# Patient Record
Sex: Female | Born: 1950 | Race: White | Hispanic: No | Marital: Married | Smoking: Never smoker
Health system: Southern US, Community
[De-identification: ages and names within clinical notes are randomized; demographics above are authoritative.]

## PROBLEM LIST (undated history)

## (undated) DIAGNOSIS — E079 Disorder of thyroid, unspecified: Secondary | ICD-10-CM

## (undated) DIAGNOSIS — K317 Polyp of stomach and duodenum: Secondary | ICD-10-CM

## (undated) DIAGNOSIS — E7800 Pure hypercholesterolemia, unspecified: Secondary | ICD-10-CM

## (undated) DIAGNOSIS — D649 Anemia, unspecified: Secondary | ICD-10-CM

## (undated) DIAGNOSIS — H905 Unspecified sensorineural hearing loss: Secondary | ICD-10-CM

## (undated) DIAGNOSIS — E611 Iron deficiency: Secondary | ICD-10-CM

## (undated) DIAGNOSIS — K760 Fatty (change of) liver, not elsewhere classified: Secondary | ICD-10-CM

## (undated) DIAGNOSIS — Z860101 Personal history of adenomatous and serrated colon polyps: Secondary | ICD-10-CM

## (undated) DIAGNOSIS — L309 Dermatitis, unspecified: Secondary | ICD-10-CM

## (undated) DIAGNOSIS — Z961 Presence of intraocular lens: Secondary | ICD-10-CM

## (undated) DIAGNOSIS — A048 Other specified bacterial intestinal infections: Secondary | ICD-10-CM

## (undated) DIAGNOSIS — E039 Hypothyroidism, unspecified: Secondary | ICD-10-CM

## (undated) DIAGNOSIS — E785 Hyperlipidemia, unspecified: Secondary | ICD-10-CM

## (undated) DIAGNOSIS — M109 Gout, unspecified: Secondary | ICD-10-CM

## (undated) DIAGNOSIS — E538 Deficiency of other specified B group vitamins: Secondary | ICD-10-CM

## (undated) DIAGNOSIS — I10 Essential (primary) hypertension: Secondary | ICD-10-CM

## (undated) HISTORY — DX: Hyperlipidemia, unspecified: E78.5

## (undated) HISTORY — DX: Essential (primary) hypertension: I10

## (undated) HISTORY — DX: Disorder of thyroid, unspecified: E07.9

## (undated) HISTORY — DX: Gout, unspecified: M10.9

## (undated) HISTORY — PX: COLONOSCOPY: SHX174

## (undated) HISTORY — PX: TOTAL ABDOMINAL HYSTERECTOMY W/ BILATERAL SALPINGOOPHORECTOMY: SHX83

---

## 2004-02-09 ENCOUNTER — Ambulatory Visit: Payer: Self-pay | Admitting: Family Medicine

## 2004-02-13 ENCOUNTER — Ambulatory Visit: Payer: Self-pay | Admitting: Family Medicine

## 2004-02-29 HISTORY — PX: BREAST BIOPSY: SHX20

## 2004-10-01 ENCOUNTER — Ambulatory Visit: Payer: Self-pay | Admitting: General Surgery

## 2005-03-22 ENCOUNTER — Ambulatory Visit: Payer: Self-pay | Admitting: Family Medicine

## 2006-01-26 ENCOUNTER — Ambulatory Visit: Payer: Self-pay | Admitting: Family Medicine

## 2006-03-24 ENCOUNTER — Ambulatory Visit: Payer: Self-pay | Admitting: Family Medicine

## 2007-03-26 ENCOUNTER — Ambulatory Visit: Payer: Self-pay | Admitting: Family Medicine

## 2007-07-18 ENCOUNTER — Other Ambulatory Visit: Payer: Self-pay

## 2007-07-18 ENCOUNTER — Ambulatory Visit: Payer: Self-pay | Admitting: Unknown Physician Specialty

## 2007-07-30 HISTORY — PX: ABDOMINAL HYSTERECTOMY: SHX81

## 2007-07-31 ENCOUNTER — Ambulatory Visit: Payer: Self-pay | Admitting: Unknown Physician Specialty

## 2007-10-30 ENCOUNTER — Ambulatory Visit: Payer: Self-pay | Admitting: Family Medicine

## 2008-03-26 ENCOUNTER — Ambulatory Visit: Payer: Self-pay | Admitting: Family Medicine

## 2009-04-08 ENCOUNTER — Ambulatory Visit: Payer: Self-pay | Admitting: Family Medicine

## 2010-02-02 ENCOUNTER — Observation Stay: Payer: Self-pay | Admitting: Internal Medicine

## 2010-04-12 ENCOUNTER — Ambulatory Visit: Payer: Self-pay | Admitting: Family Medicine

## 2011-05-13 ENCOUNTER — Ambulatory Visit: Payer: Self-pay | Admitting: Family Medicine

## 2012-05-30 ENCOUNTER — Ambulatory Visit: Payer: Self-pay | Admitting: Family Medicine

## 2013-06-19 ENCOUNTER — Ambulatory Visit: Payer: Self-pay | Admitting: Family Medicine

## 2014-06-24 ENCOUNTER — Ambulatory Visit
Admit: 2014-06-24 | Disposition: A | Discharge status: Home / Self Care | Payer: Self-pay | Attending: Family Medicine | Admitting: Family Medicine

## 2015-06-17 HISTORY — PX: BREAST BIOPSY: SHX20

## 2015-10-30 HISTORY — PX: EYE SURGERY: SHX253

## 2016-05-17 ENCOUNTER — Other Ambulatory Visit: Payer: Self-pay | Admitting: Family Medicine

## 2016-05-17 DIAGNOSIS — N6452 Nipple discharge: Secondary | ICD-10-CM

## 2016-05-17 DIAGNOSIS — Z1239 Encounter for other screening for malignant neoplasm of breast: Secondary | ICD-10-CM

## 2016-05-25 ENCOUNTER — Ambulatory Visit
Admission: RE | Admit: 2016-05-25 | Discharge: 2016-05-25 | Disposition: A | Discharge status: Home / Self Care | Payer: BLUE CROSS/BLUE SHIELD | Source: Ambulatory Visit | Attending: Family Medicine | Admitting: Family Medicine

## 2016-05-25 DIAGNOSIS — Z1239 Encounter for other screening for malignant neoplasm of breast: Secondary | ICD-10-CM

## 2016-05-25 DIAGNOSIS — N6310 Unspecified lump in the right breast, unspecified quadrant: Secondary | ICD-10-CM | POA: Insufficient documentation

## 2016-05-25 DIAGNOSIS — N6452 Nipple discharge: Secondary | ICD-10-CM

## 2016-05-26 ENCOUNTER — Emergency Department
Admission: EM | Admit: 2016-05-26 | Discharge: 2016-05-26 | Disposition: A | Discharge status: Home / Self Care | Payer: Worker's Compensation | Attending: Emergency Medicine | Admitting: Emergency Medicine

## 2016-05-26 ENCOUNTER — Encounter: Payer: Self-pay | Admitting: Emergency Medicine

## 2016-05-26 DIAGNOSIS — T7840XA Allergy, unspecified, initial encounter: Secondary | ICD-10-CM | POA: Diagnosis present

## 2016-05-26 DIAGNOSIS — T7589XA Other specified effects of external causes, initial encounter: Secondary | ICD-10-CM

## 2016-05-26 DIAGNOSIS — J9801 Acute bronchospasm: Secondary | ICD-10-CM | POA: Insufficient documentation

## 2016-05-26 DIAGNOSIS — Z77098 Contact with and (suspected) exposure to other hazardous, chiefly nonmedicinal, chemicals: Secondary | ICD-10-CM | POA: Diagnosis not present

## 2016-05-26 DIAGNOSIS — IMO0001 Reserved for inherently not codable concepts without codable children: Secondary | ICD-10-CM

## 2016-05-26 MED ORDER — IPRATROPIUM-ALBUTEROL 0.5-2.5 (3) MG/3ML IN SOLN
3.0000 mL | Freq: Once | RESPIRATORY_TRACT | Status: AC
  Administered 2016-05-26: 3 mL via RESPIRATORY_TRACT
  Filled 2016-05-26: qty 3

## 2016-05-26 NOTE — ED Triage Notes (Signed)
Pt at work this am, states they were spraying lysol in the office next to her, she began having a hard time catching her breath, states fragrance makes her struggle to breathe. Pt appears in no distress at this time, no coughing or increased respirations. Workers Comp Case.

## 2016-05-26 NOTE — Discharge Instructions (Signed)
Your exam is essentially normal following your exposure to an inhaled irritant. You should consider taking a daily allergy medicine. Avoid exposures by immediately removing yourself from the irritant, to a room or outdoors, away from the offending fumes/odors.

## 2016-05-26 NOTE — ED Provider Notes (Signed)
[facility] Emergency Department Provider Note ____________________________________________  Time seen: 1329  I have reviewed the triage vital signs and the nursing notes.  HISTORY  Chief Complaint  Allergic Reaction  HPI Lindsey Todd is a 66 y.o. female presents to the ED, accompanied by her husband, from her jobsite, following exposure to Lysol brand spray. She reports that her coworker was spraying the office next door, because the occupant was diagnosed with influenza. She began to experience coughing and shortness of breath, as she smelled the spray enter her office. She denies any nausea, vomiting, wheezing, or difficulty swallowing. She denies watery eyes, runny nose, or throat irritation/swelling. She has known sensitivity to fragrances which lead to a similar reaction. She typically walks outside to avoid the exposure, until her symptoms of coughing dissipate. She denies any current or past treatment for environmental or seasonal allergies with antihistamines, nasal sprays, decongestants, steroids, or allergy serum injections.   History reviewed. No pertinent past medical history.  There are no active problems to display for this patient.   Past Surgical History:  Procedure Laterality Date  . BREAST BIOPSY Right 2006   benign    Prior to Admission medications   Not on File    Allergies Patient has no known allergies.  No family history on file.  Social History Social History  Substance Use Topics  . Smoking status: Never Smoker  . Smokeless tobacco: Never Used  . Alcohol use No    Review of Systems  Constitutional: Negative for fever. Eyes: Negative for visual changes. Denies watery eyes ENT: Negative for sore throat. Denies runny nose Cardiovascular: Negative for chest pain. Respiratory: Positive for shortness of breath and cough. Gastrointestinal: Negative for abdominal pain, vomiting and diarrhea. Skin: Negative for  rash. Neurological: Negative for headaches, focal weakness or numbness. ____________________________________________  PHYSICAL EXAM:  VITAL SIGNS: ED Triage Vitals  Enc Vitals Group     BP 05/26/16 1235 (!) 188/85     Pulse Rate 05/26/16 1225 (!) 102     Resp 05/26/16 1235 18     Temp 05/26/16 1235 97.9 F (36.6 C)     Temp Source 05/26/16 1235 Oral     SpO2 05/26/16 1225 100 %     Weight 05/26/16 1235 250 lb (113.4 kg)     Height 05/26/16 1235 5\' 7"  (1.702 m)     Head Circumference --      Peak Flow --      Pain Score 05/26/16 1235 0     Pain Loc --      Pain Edu? --      Excl. in [place]? --    Constitutional: Alert and oriented. Well appearing and in no distress. Head: Normocephalic and atraumatic. Eyes: Conjunctivae are normal. PERRL. Normal extraocular movements Ears: Canals clear. TMs intact bilaterally. Nose: No congestion/rhinorrhea/epistaxis. Turbinates are moist, pale, and mildly edematous. Mouth/Throat: Mucous membranes are moist. Uvula is midline and tonsils are flat.  Neck: Supple. No thyromegaly. Hematological/Lymphatic/Immunological: No cervical lymphadenopathy. Cardiovascular: Normal rate, regular rhythm. Normal distal pulses. Respiratory: Normal respiratory effort. No wheezes/rales/rhonchi. Skin:  Skin is warm, dry and intact. No rash noted. Psychiatric: Mood and affect are normal. Patient exhibits appropriate insight and judgment. ____________________________________________  PROCEDURES  DuoNeb x 1 ____________________________________________  INITIAL IMPRESSION / ASSESSMENT AND PLAN / ED COURSE  Patient with acute bronchospasms due to inhaled irritant exposure. She is at least 4 hours status-post initial exposure. She is without respiratory distress or signs of anaphylaxis on  presentation. She exhibits a mild, intermittent cough during her tenure. She reports symptoms have basically resolved at the time of discharge. She is encouraged to restart her daily  Zyrtec. A work note for the remainder of the day is provided.  She has pre-existing plans to see an allergist in May.  ____________________________________________  FINAL CLINICAL IMPRESSION(S) / ED DIAGNOSES  Final diagnoses:  Acute bronchospasm  Exposure to respiratory irritant, initial encounter      Melvenia Needles, PA-C 05/26/16 1412    Eula Listen, MD 05/26/16 [phone]

## 2016-05-26 NOTE — ED Notes (Signed)
Pt sitting in wheelchair, family present, pt appears in no distress at this time.

## 2016-05-26 NOTE — ED Notes (Signed)
Pt states she has had a cold since late last week; she was near an office that was sprayed with Lysol today. She states that she could feel the lysol entering her lungs and she felt sob. She reports also that she was unable to drive because she felt nervous and jittery. She reports dry mouth and burning throat, but denies any feeling of her throat closing up or swelling in her mouth. Pt states her earlobes are still burning. No visible rash on face or ears. Pt states it is "a little bit better than it had been" at this time. It has been 2 hours since she left the office where she was exposed.

## 2016-05-26 NOTE — ED Notes (Signed)
No use of epi pen.

## 2016-05-26 NOTE — ED Notes (Signed)
Per workers comp profile for [employer], no drug screen is required.

## 2016-05-31 ENCOUNTER — Other Ambulatory Visit: Payer: Self-pay | Admitting: Family Medicine

## 2016-05-31 DIAGNOSIS — N6310 Unspecified lump in the right breast, unspecified quadrant: Secondary | ICD-10-CM

## 2016-05-31 DIAGNOSIS — R928 Other abnormal and inconclusive findings on diagnostic imaging of breast: Secondary | ICD-10-CM

## 2016-06-16 ENCOUNTER — Other Ambulatory Visit: Payer: Self-pay | Admitting: Diagnostic Radiology

## 2016-06-16 ENCOUNTER — Ambulatory Visit
Admission: RE | Admit: 2016-06-16 | Discharge: 2016-06-16 | Disposition: A | Discharge status: Home / Self Care | Payer: BLUE CROSS/BLUE SHIELD | Source: Ambulatory Visit | Attending: Family Medicine | Admitting: Family Medicine

## 2016-06-16 DIAGNOSIS — N6310 Unspecified lump in the right breast, unspecified quadrant: Secondary | ICD-10-CM | POA: Diagnosis present

## 2016-06-16 DIAGNOSIS — R928 Other abnormal and inconclusive findings on diagnostic imaging of breast: Secondary | ICD-10-CM | POA: Diagnosis present

## 2016-06-16 DIAGNOSIS — D241 Benign neoplasm of right breast: Secondary | ICD-10-CM | POA: Diagnosis not present

## 2016-06-17 LAB — SURGICAL PATHOLOGY

## 2016-06-28 ENCOUNTER — Encounter: Payer: Self-pay | Admitting: *Deleted

## 2016-07-04 ENCOUNTER — Ambulatory Visit (INDEPENDENT_AMBULATORY_CARE_PROVIDER_SITE_OTHER): Payer: BLUE CROSS/BLUE SHIELD | Admitting: General Surgery

## 2016-07-04 ENCOUNTER — Encounter: Payer: Self-pay | Admitting: General Surgery

## 2016-07-04 VITALS — BP 138/90 | HR 90 | Resp 14 | Ht 67.0 in | Wt 267.0 lb

## 2016-07-04 DIAGNOSIS — D241 Benign neoplasm of right breast: Secondary | ICD-10-CM

## 2016-07-04 NOTE — Progress Notes (Signed)
Patient ID: LEGEND PECORE, female   DOB: [birth year], 66 y.o.   MRN: [id number]  Chief Complaint  Patient presents with  . Other    breast papilloma    HPI Lindsey Todd is a 66 y.o. female here for discussion of breast biopsy results. Her last mammogram was 05/25/16. She had been having some clear nipple discharge from her right breast in December. She had a biopsy of the right breast done on 06/16/16 and was diagnosed with a breast papilloma. She has not had any discharge since her biopsy. She had felt fullness in the right breast but no pain. She had a stereo biopsy in 2006 on the right side.  HPI  Past Medical History:  Diagnosis Date  . Gout   . Hyperlipidemia   . Hypertension   . Thyroid disease     Past Surgical History:  Procedure Laterality Date  . ABDOMINAL HYSTERECTOMY  07/2007  . BREAST BIOPSY Right 2006   benign  . BREAST BIOPSY Right 06/17/2015   right breast biopsy path pending   . EYE SURGERY Bilateral 10/2015    No family history on file.  Social History Social History  Substance Use Topics  . Smoking status: Never Smoker  . Smokeless tobacco: Never Used  . Alcohol use No    No Known Allergies  Current Outpatient Prescriptions  Medication Sig Dispense Refill  . allopurinol (ZYLOPRIM) 100 MG tablet One by mouth daily to prevent gout    . amLODipine (NORVASC) 5 MG tablet Take by mouth.    . Ascorbic Acid (VITAMIN C) 100 MG tablet Take by mouth.    Marland Kitchen atorvastatin (LIPITOR) 10 MG tablet Take by mouth.    . colchicine 0.6 MG tablet   0  . Iron-Vitamin C (IRON 100/C PO) Take by mouth.    . levothyroxine (SYNTHROID, LEVOTHROID) 25 MCG tablet   0   No current facility-administered medications for this visit.     Review of Systems Review of Systems  Constitutional: Negative.   Respiratory: Negative.   Cardiovascular: Negative.     Blood pressure 138/90, pulse 90, resp. rate 14, height 5\' 7"  (1.702 m), weight 267 lb (121.1 kg).  Physical  Exam Physical Exam  Constitutional: She is oriented to person, place, and time. She appears well-developed and well-nourished.  Eyes: Conjunctivae are normal. No scleral icterus.  Neck: Neck supple.  Cardiovascular: Normal rate, regular rhythm and normal heart sounds.   Pulmonary/Chest: Effort normal and breath sounds normal. Right breast exhibits nipple discharge (single drop). Right breast exhibits no inverted nipple, no mass, no skin change and no tenderness. Left breast exhibits no inverted nipple, no mass, no nipple discharge, no skin change and no tenderness. Breasts are asymmetrical (right breast 1 cup size larger than left).    Lymphadenopathy:    She has no cervical adenopathy.    She has no axillary adenopathy.  Neurological: She is alert and oriented to person, place, and time.  Skin: Skin is warm and dry.  Psychiatric: She has a normal mood and affect.    Data Reviewed 06/24/2014 and 05/25/2016 screening mammograms were reviewed. Right breast ultrasound of 05/25/2016 reviewed. Ultrasound-guided biopsy completed 22 days after her original diagnostic imaging study reviewed.  DIAGNOSIS:  A. BREAST, RIGHT 9:00, 1 CM FN; ULTRASOUND GUIDED BIOPSY:  - INTRADUCTAL PAPILLOMA.  - NEGATIVE FOR ATYPIA AND MALIGNANCY.   Assessment    Nipple drainage likely related to papilloma    Plan    Options for  management were reviewed: 1) surgical excision versus 2) observation.  Pros and cons of each pathway reviewed. More office visits with the latter, more invasive procedure with the former.  The American Society of Breast Surgeons consensus guidelines on concordance accepts observation for core needle biopsy diagnosis of papilloma if a formal plan a follow-up through risk based shared decision making with the patient. Risk of upgrade from. Papillary lesions has been less than 10% in the last decade.(December 31, 2014)/ At this time the patient and I both comfortable with observation. She  has been requested to call promptly should she develop recurrent spontaneous nipple drainage, breast tenderness, fullness or pain.    HPI, Physical Exam, Assessment and Plan have been scribed under the direction and in the presence of Robert Bellow, MD  Lindsey Living, LPN  I have completed the exam and reviewed the above documentation for accuracy and completeness.  I agree with the above.  Haematologist has been used and any errors in dictation or transcription are unintentional.  Hervey Ard, M.D., F.A.C.S.   Robert Bellow 07/04/2016, 6:28 PM

## 2016-07-04 NOTE — Patient Instructions (Signed)
Right breast diagnostic mammogram and follow up in 6 months. Call with any problems or concerns.

## 2016-09-15 ENCOUNTER — Encounter: Payer: Self-pay | Admitting: *Deleted

## 2016-09-16 ENCOUNTER — Ambulatory Visit: Payer: BLUE CROSS/BLUE SHIELD | Admitting: Anesthesiology

## 2016-09-16 ENCOUNTER — Ambulatory Visit
Admission: RE | Admit: 2016-09-16 | Discharge: 2016-09-16 | Disposition: A | Discharge status: Home / Self Care | Payer: BLUE CROSS/BLUE SHIELD | Source: Ambulatory Visit | Attending: Unknown Physician Specialty | Admitting: Unknown Physician Specialty

## 2016-09-16 ENCOUNTER — Encounter
Admission: RE | Disposition: A | Discharge status: Home / Self Care | Payer: Self-pay | Source: Ambulatory Visit | Attending: Unknown Physician Specialty

## 2016-09-16 ENCOUNTER — Encounter: Payer: Self-pay | Admitting: *Deleted

## 2016-09-16 DIAGNOSIS — D509 Iron deficiency anemia, unspecified: Secondary | ICD-10-CM | POA: Insufficient documentation

## 2016-09-16 DIAGNOSIS — D127 Benign neoplasm of rectosigmoid junction: Secondary | ICD-10-CM | POA: Insufficient documentation

## 2016-09-16 DIAGNOSIS — E785 Hyperlipidemia, unspecified: Secondary | ICD-10-CM | POA: Insufficient documentation

## 2016-09-16 DIAGNOSIS — K766 Portal hypertension: Secondary | ICD-10-CM | POA: Insufficient documentation

## 2016-09-16 DIAGNOSIS — K317 Polyp of stomach and duodenum: Secondary | ICD-10-CM | POA: Diagnosis not present

## 2016-09-16 DIAGNOSIS — Z90710 Acquired absence of both cervix and uterus: Secondary | ICD-10-CM | POA: Diagnosis not present

## 2016-09-16 DIAGNOSIS — K2970 Gastritis, unspecified, without bleeding: Secondary | ICD-10-CM | POA: Diagnosis not present

## 2016-09-16 DIAGNOSIS — E7800 Pure hypercholesterolemia, unspecified: Secondary | ICD-10-CM | POA: Diagnosis not present

## 2016-09-16 DIAGNOSIS — D125 Benign neoplasm of sigmoid colon: Secondary | ICD-10-CM | POA: Insufficient documentation

## 2016-09-16 DIAGNOSIS — Z90722 Acquired absence of ovaries, bilateral: Secondary | ICD-10-CM | POA: Diagnosis not present

## 2016-09-16 DIAGNOSIS — E538 Deficiency of other specified B group vitamins: Secondary | ICD-10-CM | POA: Insufficient documentation

## 2016-09-16 DIAGNOSIS — I10 Essential (primary) hypertension: Secondary | ICD-10-CM | POA: Insufficient documentation

## 2016-09-16 DIAGNOSIS — K5730 Diverticulosis of large intestine without perforation or abscess without bleeding: Secondary | ICD-10-CM | POA: Insufficient documentation

## 2016-09-16 DIAGNOSIS — E039 Hypothyroidism, unspecified: Secondary | ICD-10-CM | POA: Diagnosis not present

## 2016-09-16 DIAGNOSIS — M109 Gout, unspecified: Secondary | ICD-10-CM | POA: Insufficient documentation

## 2016-09-16 DIAGNOSIS — K3189 Other diseases of stomach and duodenum: Secondary | ICD-10-CM | POA: Insufficient documentation

## 2016-09-16 DIAGNOSIS — Z79899 Other long term (current) drug therapy: Secondary | ICD-10-CM | POA: Diagnosis not present

## 2016-09-16 DIAGNOSIS — D649 Anemia, unspecified: Secondary | ICD-10-CM | POA: Diagnosis not present

## 2016-09-16 HISTORY — DX: Iron deficiency: E61.1

## 2016-09-16 HISTORY — PX: ESOPHAGOGASTRODUODENOSCOPY (EGD) WITH PROPOFOL: SHX5813

## 2016-09-16 HISTORY — PX: COLONOSCOPY WITH PROPOFOL: SHX5780

## 2016-09-16 HISTORY — DX: Deficiency of other specified B group vitamins: E53.8

## 2016-09-16 HISTORY — DX: Pure hypercholesterolemia, unspecified: E78.00

## 2016-09-16 HISTORY — DX: Hypothyroidism, unspecified: E03.9

## 2016-09-16 HISTORY — DX: Anemia, unspecified: D64.9

## 2016-09-16 SURGERY — ESOPHAGOGASTRODUODENOSCOPY (EGD) WITH PROPOFOL
Anesthesia: General

## 2016-09-16 MED ORDER — PROPOFOL 500 MG/50ML IV EMUL
INTRAVENOUS | Status: AC
  Filled 2016-09-16: qty 50

## 2016-09-16 MED ORDER — PROPOFOL 10 MG/ML IV BOLUS
INTRAVENOUS | Status: AC
  Filled 2016-09-16: qty 20

## 2016-09-16 MED ORDER — MIDAZOLAM HCL 2 MG/2ML IJ SOLN
INTRAMUSCULAR | Status: DC | PRN
  Administered 2016-09-16 (×2): 1 mg via INTRAVENOUS

## 2016-09-16 MED ORDER — LIDOCAINE HCL (PF) 2 % IJ SOLN
INTRAMUSCULAR | Status: AC
  Filled 2016-09-16: qty 2

## 2016-09-16 MED ORDER — SODIUM CHLORIDE 0.9 % IV SOLN: INTRAVENOUS | Status: DC

## 2016-09-16 MED ORDER — SODIUM CHLORIDE 0.9 % IV SOLN
INTRAVENOUS | Status: DC
  Administered 2016-09-16: 1000 mL via INTRAVENOUS

## 2016-09-16 MED ORDER — MIDAZOLAM HCL 2 MG/2ML IJ SOLN
INTRAMUSCULAR | Status: AC
  Filled 2016-09-16: qty 2

## 2016-09-16 MED ORDER — LIDOCAINE HCL (CARDIAC) 20 MG/ML IV SOLN
INTRAVENOUS | Status: DC | PRN
  Administered 2016-09-16: 30 mg via INTRAVENOUS

## 2016-09-16 MED ORDER — PROPOFOL 500 MG/50ML IV EMUL
INTRAVENOUS | Status: DC | PRN
  Administered 2016-09-16: 120 ug/kg/min via INTRAVENOUS

## 2016-09-16 MED ORDER — PROPOFOL 10 MG/ML IV BOLUS
INTRAVENOUS | Status: DC | PRN
  Administered 2016-09-16 (×2): 20 mg via INTRAVENOUS
  Administered 2016-09-16: 10 mg via INTRAVENOUS
  Administered 2016-09-16: 30 mg via INTRAVENOUS

## 2016-09-16 NOTE — Anesthesia Postprocedure Evaluation (Signed)
Anesthesia Post Note  Patient: Lindsey Todd  Procedure(s) Performed: Procedure(s) (LRB): ESOPHAGOGASTRODUODENOSCOPY (EGD) WITH PROPOFOL (N/A) COLONOSCOPY WITH PROPOFOL (N/A)  Patient location during evaluation: Endoscopy Anesthesia Type: General Level of consciousness: awake and alert and oriented Pain management: pain level controlled Vital Signs Assessment: post-procedure vital signs reviewed and stable Respiratory status: spontaneous breathing, nonlabored ventilation and respiratory function stable Cardiovascular status: blood pressure returned to baseline and stable Postop Assessment: no signs of nausea or vomiting Anesthetic complications: no     Last Vitals:  Vitals:   09/16/16 1149 09/16/16 1159  BP: 126/73 128/87  Pulse: 80 71  Resp: 14 14  Temp:      Last Pain:  Vitals:   09/16/16 1129  TempSrc: Tympanic                 [name]

## 2016-09-16 NOTE — Op Note (Signed)
[facility] Gastroenterology Patient Name: Lindsey Todd Procedure Date: 09/16/2016 10:31 AM MRN: [id number] Account #: [id number] Date of Birth: 1951-01-24 Admit Type: Outpatient Age: 66 Room: [facility] ENDO ROOM 3 Gender: Female Note Status: Finalized Procedure:            Upper GI endoscopy Indications:          Unexplained iron deficiency anemia Providers:            Manya Silvas, MD Referring MD:         Gayland Curry MD, MD (Referring MD) Medicines:            Propofol per Anesthesia Complications:        No immediate complications. Procedure:            Pre-Anesthesia Assessment:                       - After reviewing the risks and benefits, the patient                        was deemed in satisfactory condition to undergo the                        procedure.                       After obtaining informed consent, the endoscope was                        passed under direct vision. Throughout the procedure,                        the patient's blood pressure, pulse, and oxygen                        saturations were monitored continuously. The Endoscope                        was introduced through the mouth, and advanced to the                        second part of duodenum. The upper GI endoscopy was                        accomplished without difficulty. The patient tolerated                        the procedure well. Findings:      The examined esophagus was normal.      Mild, moderate portal hypertensive gastropathy was found in the gastric       fundus and in the gastric body.      A single 8 mm sessile polyp with no bleeding was found in the duodenal       bulb. The polyp was villous looking and after biopsy done it bled. The       polyp was removed with a hot snare. Resection and retrieval were       complete.      The second portion of the duodenum was normal. To prevent bleeding after       the polypectomy, one hemostatic clip was  successfully placed. There was  no bleeding at the end of the procedure. Impression:           - Normal esophagus.                       - Portal hypertensive gastropathy.                       - A single duodenal polyp. Resected and retrieved.                       - Normal second portion of the duodenum. Clip was                        placed. Recommendation:       - Await pathology results. Manya Silvas, MD 09/16/2016 11:02:53 AM This report has been signed electronically. Number of Addenda: 0 Note Initiated On: 09/16/2016 10:31 AM      [facility]

## 2016-09-16 NOTE — H&P (Signed)
Primary Care Physician:  Gayland Curry, MD Primary Gastroenterologist:  Dr. Vira Agar  Pre-Procedure History & Physical: HPI:  Lindsey Todd is a 66 y.o. female is here for an endoscopy and colonoscopy.   Past Medical History:  Diagnosis Date  . Anemia   . Elevated LDL cholesterol level   . Gout   . Gout   . Hyperlipidemia   . Hypertension   . Hypothyroidism   . Iron deficiency   . PONV (postoperative nausea and vomiting)   . Thyroid disease   . Vitamin B 12 deficiency     Past Surgical History:  Procedure Laterality Date  . ABDOMINAL HYSTERECTOMY  07/2007  . BREAST BIOPSY Right 2006   benign  . BREAST BIOPSY Right 06/17/2015   right breast biopsy path pending   . COLONOSCOPY    . EYE SURGERY Bilateral 10/2015  . TOTAL ABDOMINAL HYSTERECTOMY W/ BILATERAL SALPINGOOPHORECTOMY      Prior to Admission medications   Medication Sig Start Date End Date Taking? Authorizing Provider  amLODipine (NORVASC) 5 MG tablet Take by mouth. 05/17/16 05/17/17 Yes [provider]  levothyroxine (SYNTHROID, LEVOTHROID) 25 MCG tablet  05/13/16  Yes [provider]  ondansetron (ZOFRAN-ODT) 4 MG disintegrating tablet Take 4 mg by mouth every 8 (eight) hours as needed for nausea or vomiting.   Yes [provider]  allopurinol (ZYLOPRIM) 100 MG tablet One by mouth daily to prevent gout 05/17/16   [provider]  Ascorbic Acid (VITAMIN C) 100 MG tablet Take by mouth.    [provider]  atorvastatin (LIPITOR) 10 MG tablet Take by mouth. 05/18/16 05/18/17  [provider]  colchicine 0.6 MG tablet  05/17/16   [provider]  Iron-Vitamin C (IRON 100/C PO) Take by mouth.    [provider]    Allergies as of 06/27/2016  . (No Known Allergies)    History reviewed. No pertinent family history.  Social History   Social History  . Marital status: Married    Spouse name: N/A  . Number of children: N/A  . Years of  education: N/A   Occupational History  . Not on file.   Social History Main Topics  . Smoking status: Never Smoker  . Smokeless tobacco: Never Used  . Alcohol use No  . Drug use: No  . Sexual activity: Not on file   Other Topics Concern  . Not on file   Social History Narrative  . No narrative on file    Review of Systems: See HPI, otherwise negative ROS  Physical Exam: BP (!) 156/96   Pulse 81   Temp 98.4 F (36.9 C) (Tympanic)   Resp 16   Ht 5\' 8"  (1.727 m)   Wt 113.4 kg (250 lb)   SpO2 98%   BMI 38.01 kg/m  General:   Alert,  pleasant and cooperative in NAD Head:  Normocephalic and atraumatic. Neck:  Supple; no masses or thyromegaly. Lungs:  Clear throughout to auscultation.    Heart:  Regular rate and rhythm. Abdomen:  Soft, nontender and nondistended. Normal bowel sounds, without guarding, and without rebound.   Neurologic:  Alert and  oriented x4;  grossly normal neurologically.  Impression/Plan: Lindsey Todd is here for an endoscopy and colonoscopy to be performed for iron def anemia.  Risks, benefits, limitations, and alternatives regarding  endoscopy and colonoscopy have been reviewed with the patient.  Questions have been answered.  All parties agreeable.   Gaylyn Cheers, MD  09/16/2016, 10:32 AM

## 2016-09-16 NOTE — Transfer of Care (Signed)
Immediate Anesthesia Transfer of Care Note  Patient: Lindsey Todd  Procedure(s) Performed: Procedure(s): ESOPHAGOGASTRODUODENOSCOPY (EGD) WITH PROPOFOL (N/A) COLONOSCOPY WITH PROPOFOL (N/A)  Patient Location: PACU  Anesthesia Type:General  Level of Consciousness: sedated  Airway & Oxygen Therapy: Patient Spontanous Breathing and Patient connected to nasal cannula oxygen  Post-op Assessment: Report given to RN and Post -op Vital signs reviewed and stable  Post vital signs: Reviewed and stable  Last Vitals:  Vitals:   09/16/16 0939 09/16/16 1129  BP: (!) 156/96 126/60  Pulse: 81 92  Resp: 16 20  Temp: 36.9 C (!) 36.1 C    Last Pain:  Vitals:   09/16/16 1129  TempSrc: Tympanic         Complications: No apparent anesthesia complications

## 2016-09-16 NOTE — Op Note (Signed)
[facility] Gastroenterology Patient Name: Gaytha Raybourn Procedure Date: 09/16/2016 10:31 AM MRN: [id number] Account #: [id number] Date of Birth: Mar 05, 1950 Admit Type: Outpatient Age: 66 Room: [facility] ENDO ROOM 3 Gender: Female Note Status: Finalized Procedure:            Colonoscopy Indications:          Unexplained iron deficiency anemia Providers:            Manya Silvas, MD Medicines:            Propofol per Anesthesia Complications:        No immediate complications. Procedure:            Pre-Anesthesia Assessment:                       - After reviewing the risks and benefits, the patient                        was deemed in satisfactory condition to undergo the                        procedure.                       After obtaining informed consent, the colonoscope was                        passed under direct vision. Throughout the procedure,                        the patient's blood pressure, pulse, and oxygen                        saturations were monitored continuously. The                        Colonoscope was introduced through the anus and                        advanced to the the cecum, identified by appendiceal                        orifice and ileocecal valve. The colonoscopy was                        somewhat difficult due to significant looping.                        Successful completion of the procedure was aided by                        applying abdominal pressure. The patient tolerated the                        procedure well. The quality of the bowel preparation                        was adequate to identify polyps. Findings:      Two sessile polyps were found in the sigmoid colon. The polyps were       diminutive in size. These polyps were removed with a jumbo cold forceps.  Resection and retrieval were complete.      A diminutive polyp was found in the recto-sigmoid colon. The polyp was       sessile. The polyp was  removed with a jumbo forceps. Resection and       retrieval were complete.      A few small-mouthed diverticula were found in the sigmoid colon.      A small polyp was found in the sigmoid colon. The polyp was sessile. The       polyp was removed with a hot snare. Resection and retrieval were       complete. Impression:           - One diminutive polyp in the sigmoid colon, removed                        with a cold snare. Resected and retrieved.                       - Two diminutive polyps in the sigmoid colon, removed                        with a jumbo cold forceps. Resected and retrieved.                       - One diminutive polyp at the recto-sigmoid colon,                        removed with a hot snare. Resected and retrieved.                       - Diverticulosis in the sigmoid colon. Recommendation:       - Await pathology results. Manya Silvas, MD 09/16/2016 11:29:39 AM This report has been signed electronically. Number of Addenda: 0 Note Initiated On: 09/16/2016 10:31 AM Scope Withdrawal Time: 0 hours 12 minutes 47 seconds  Total Procedure Duration: 0 hours 18 minutes 58 seconds       [facility]

## 2016-09-16 NOTE — Anesthesia Procedure Notes (Signed)
Date/Time: 09/16/2016 10:39 AM Performed by: Johnna Acosta Pre-anesthesia Checklist: Patient identified, Emergency Drugs available, Suction available, Patient being monitored and Timeout performed Patient Re-evaluated:Patient Re-evaluated prior to induction Oxygen Delivery Method: Nasal cannula

## 2016-09-16 NOTE — Anesthesia Preprocedure Evaluation (Signed)
Anesthesia Evaluation  Patient identified by MRN, date of birth, ID band Patient awake    Reviewed: Allergy & Precautions, H&P , NPO status , Patient's Chart, lab work & pertinent test results  History of Anesthesia Complications (+) PONV and history of anesthetic complications  Airway Mallampati: III  TM Distance: >3 FB Neck ROM: full    Dental  (+) Caps, Chipped   Pulmonary neg pulmonary ROS, neg shortness of breath,           Cardiovascular Exercise Tolerance: Good hypertension, (-) angina(-) Past MI and (-) DOE      Neuro/Psych negative neurological ROS  negative psych ROS   GI/Hepatic Neg liver ROS, GERD  Medicated and Controlled,  Endo/Other  Hypothyroidism   Renal/GU negative Renal ROS  negative genitourinary   Musculoskeletal   Abdominal   Peds  Hematology negative hematology ROS (+)   Anesthesia Other Findings Past Medical History: No date: Anemia No date: Elevated LDL cholesterol level No date: Gout No date: Gout No date: Hyperlipidemia No date: Hypertension No date: Hypothyroidism No date: Iron deficiency No date: PONV (postoperative nausea and vomiting) No date: Thyroid disease No date: Vitamin B 12 deficiency  Past Surgical History: 07/2007: ABDOMINAL HYSTERECTOMY 2006: BREAST BIOPSY; Right     Comment:  benign 06/17/2015: BREAST BIOPSY; Right     Comment:  right breast biopsy path pending  No date: COLONOSCOPY 10/2015: EYE SURGERY; Bilateral No date: TOTAL ABDOMINAL HYSTERECTOMY W/ BILATERAL SALPINGOOPHORECTOMY  BMI    Body Mass Index:  38.01 kg/m      Reproductive/Obstetrics negative OB ROS                             Anesthesia Physical Anesthesia Plan  ASA: III  Anesthesia Plan: General   Post-op Pain Management:    Induction: Intravenous  PONV Risk Score and Plan:   Airway Management Planned: Natural Airway and Nasal Cannula  Additional  Equipment:   Intra-op Plan:   Post-operative Plan:   Informed Consent: I have reviewed the patients History and Physical, chart, labs and discussed the procedure including the risks, benefits and alternatives for the proposed anesthesia with the patient or authorized representative who has indicated his/her understanding and acceptance.   Dental Advisory Given  Plan Discussed with: Anesthesiologist, CRNA and Surgeon  Anesthesia Plan Comments: (Patient consented for risks of anesthesia including but not limited to:  - adverse reactions to medications - risk of intubation if required - damage to teeth, lips or other oral mucosa - sore throat or hoarseness - Damage to heart, brain, lungs or loss of life  Patient voiced understanding.)        Anesthesia Quick Evaluation

## 2016-09-16 NOTE — Anesthesia Post-op Follow-up Note (Cosign Needed)
Anesthesia QCDR form completed.        

## 2016-09-19 ENCOUNTER — Encounter: Payer: Self-pay | Admitting: Unknown Physician Specialty

## 2016-09-20 LAB — SURGICAL PATHOLOGY

## 2016-11-28 ENCOUNTER — Inpatient Hospital Stay: Payer: Self-pay

## 2016-11-28 ENCOUNTER — Ambulatory Visit (INDEPENDENT_AMBULATORY_CARE_PROVIDER_SITE_OTHER): Payer: BLUE CROSS/BLUE SHIELD | Admitting: General Surgery

## 2016-11-28 VITALS — BP 134/74 | HR 84 | Resp 12 | Ht 67.0 in | Wt 258.0 lb

## 2016-11-28 DIAGNOSIS — D241 Benign neoplasm of right breast: Secondary | ICD-10-CM | POA: Diagnosis not present

## 2016-11-28 NOTE — Progress Notes (Signed)
Patient ID: Lindsey Todd, female   DOB: 06/08/1950, 66 y.o.   MRN: [id number]  Chief Complaint  Patient presents with  . Follow-up    HPI Lindsey Todd is a 66 y.o. female here today for a right breast ultrasound. Patient state she is still having some discharge right breast. The patient reports that the frequency of drainage is slowly decreasing, but drainage fine continues to be either clear yellow or occasionally blood-tinged.  HPI  Past Medical History:  Diagnosis Date  . Anemia   . Elevated LDL cholesterol level   . Gout   . Gout   . Hyperlipidemia   . Hypertension   . Hypothyroidism   . Iron deficiency   . PONV (postoperative nausea and vomiting)   . Thyroid disease   . Vitamin B 12 deficiency     Past Surgical History:  Procedure Laterality Date  . ABDOMINAL HYSTERECTOMY  07/2007  . BREAST BIOPSY Right 2006   benign  . BREAST BIOPSY Right 06/17/2015   right breast biopsy path pending   . COLONOSCOPY    . COLONOSCOPY WITH PROPOFOL N/A 09/16/2016   Procedure: COLONOSCOPY WITH PROPOFOL;  Surgeon: Manya Silvas, MD;  Location: [facility] ENDOSCOPY;  Service: Endoscopy;  Laterality: N/A;  . ESOPHAGOGASTRODUODENOSCOPY (EGD) WITH PROPOFOL N/A 09/16/2016   Procedure: ESOPHAGOGASTRODUODENOSCOPY (EGD) WITH PROPOFOL;  Surgeon: Manya Silvas, MD;  Location: [facility] ENDOSCOPY;  Service: Endoscopy;  Laterality: N/A;  . EYE SURGERY Bilateral 10/2015  . TOTAL ABDOMINAL HYSTERECTOMY W/ BILATERAL SALPINGOOPHORECTOMY      No family history on file.  Social History Social History  Substance Use Topics  . Smoking status: Never Smoker  . Smokeless tobacco: Never Used  . Alcohol use No    No Known Allergies  Current Outpatient Prescriptions  Medication Sig Dispense Refill  . allopurinol (ZYLOPRIM) 100 MG tablet One by mouth daily to prevent gout    . amLODipine (NORVASC) 5 MG tablet Take by mouth.    . Ascorbic Acid (VITAMIN C) 100 MG tablet Take by mouth.    Marland Kitchen  atorvastatin (LIPITOR) 10 MG tablet Take by mouth.    . colchicine 0.6 MG tablet   0  . Iron-Vitamin C (IRON 100/C PO) Take by mouth.    . levothyroxine (SYNTHROID, LEVOTHROID) 25 MCG tablet   0  . ondansetron (ZOFRAN-ODT) 4 MG disintegrating tablet Take 4 mg by mouth every 8 (eight) hours as needed for nausea or vomiting.     No current facility-administered medications for this visit.     Review of Systems Review of Systems  Constitutional: Negative.   Respiratory: Negative.   Cardiovascular: Negative.     Blood pressure 134/74, pulse 84, resp. rate 12, height 5\' 7"  (1.702 m), weight 258 lb (117 kg).  Physical Exam Physical Exam  Constitutional: She is oriented to person, place, and time. She appears well-developed and well-nourished.  Eyes: Conjunctivae are normal. No scleral icterus.  Neck: Neck supple.  Cardiovascular: Normal rate, regular rhythm and normal heart sounds.   Pulmonary/Chest: Effort normal and breath sounds normal. Right breast exhibits no inverted nipple, no mass, no nipple discharge, no skin change and no tenderness. Left breast exhibits no inverted nipple, no mass, no nipple discharge, no skin change and no tenderness. Breasts are asymmetrical ( right breast bigger than left breast. ).  No right nipple discharge with vigorous manipulation.  Lymphadenopathy:    She has no cervical adenopathy.    She has no axillary adenopathy.  Neurological:  She is alert and oriented to person, place, and time.  Skin: Skin is warm and dry.    Data Reviewed 06/16/2016 core sample of the right breast:  DIAGNOSIS:  A. BREAST, RIGHT 9:00, 1 CM FN; ULTRASOUND GUIDED BIOPSY:  - INTRADUCTAL PAPILLOMA.  - NEGATIVE FOR ATYPIA AND MALIGNANCY.    Ultrasound examination of the right breast at the 9:00 position shows the previously identified dilated duct with a intraluminal lesion essentially unchanged. In maximum dimensions this measures 0.4 x 0.47 x 1.4 cm. The distance between the  nipple and the edge of the dilated duct measures 2.29 cm. BI-RADS-3.  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    Plan         Discussed right breast excision at [facility] or right breast [facility] vacuum biopsy in office. Pros and cons of each reviewed. Patient to consider her options and notify the office of how she would like to proceed.  HPI, Physical Exam, Assessment and Plan have been scribed under the direction and in the presence of Hervey Ard, MD.  Gaspar Cola, CMA  I have completed the exam and reviewed the above documentation for accuracy and completeness.  I agree with the above.  Haematologist has been used and any errors in dictation or transcription are unintentional.  Hervey Ard, M.D., F.A.C.S.  Robert Bellow 11/28/2016, 3:42 PM

## 2016-11-28 NOTE — Patient Instructions (Signed)
Discussed right breast excision at [facility] or right breast [facility] biopsy in office.

## 2016-11-30 ENCOUNTER — Telehealth: Payer: Self-pay | Admitting: General Surgery

## 2016-11-30 NOTE — Telephone Encounter (Signed)
PATIENT CALLED AND STATED SHE HAD DECIDED TO HAVE THE RIGHT BREAST EXCISION AT [facility].PLEASE COMPLETE SHEET & SEND TO MICHELE OR CARYL-LY TO SCHEDULE SURGERY.PATIENT CAN BE CONTACTED AT [phone].

## 2016-12-12 ENCOUNTER — Telehealth: Payer: Self-pay | Admitting: *Deleted

## 2016-12-12 ENCOUNTER — Other Ambulatory Visit: Payer: Self-pay | Admitting: General Surgery

## 2016-12-12 NOTE — Telephone Encounter (Signed)
Patient's surgery has been scheduled for 12-19-16 at [facility].

## 2016-12-15 ENCOUNTER — Inpatient Hospital Stay: Admission: RE | Admit: 2016-12-15 | Payer: Self-pay | Source: Ambulatory Visit

## 2016-12-16 ENCOUNTER — Encounter
Admission: RE | Admit: 2016-12-16 | Discharge: 2016-12-16 | Disposition: A | Discharge status: Home / Self Care | Payer: BLUE CROSS/BLUE SHIELD | Source: Ambulatory Visit | Attending: General Surgery | Admitting: General Surgery

## 2016-12-16 NOTE — Pre-Procedure Instructions (Signed)
PT WAS SCHEDULED FOR PHONE INTERVIEW ON 12-15-16 FOR HER UPCOMING 12-19-16 SURGERY WITH DR Bary Castilla.  I CALLED PT MULTIPLE TIMES ON THURSDAY(12-15-16)  AND AGAIN ON Friday (12-16-16)-CALLED BOTH HER HOME AND CELL # LEAVING MESSAGES.  I CALLED CARYL-LYN AT DR [place] OFFICE TODAY INFORMING HER PT IS NOT RETURNING MY  PHONE CALLS. CARYL-LYN SAID SHE WOULD TRY AND REACH PT.  PT FINALLY JUST CALLED ME BACK. PT NEEDS EKG DUE TO HTN.  EKG WILL HAVE TO BE DONE AM OF SURGERY NOW SINCE PT DID NOT RETURN MY CALL UNTIL Friday AFTERNOOON

## 2016-12-16 NOTE — Patient Instructions (Signed)
Your procedure is scheduled on: 12-19-16 Report to Same Day Surgery 2nd floor medical mall [facility] Entrance-take elevator on left to 2nd floor.  Check in with surgery information desk.) To find out your arrival time please call [phone] between 1PM - 3PM on 12-16-16  Remember: Instructions that are not followed completely may result in serious medical risk, up to and including death, or upon the discretion of your surgeon and anesthesiologist your surgery may need to be rescheduled.    _x___ 1. Do not eat food after midnight the night before your procedure. NO GUM CHEWING OR HARD CANDIES.  You may drink clear liquids up to 2 hours before you are scheduled to arrive at the hospital for your procedure.  Do not drink clear liquids within 2 hours of your scheduled arrival to the hospital.  Clear liquids include  --Water or Apple juice without pulp  --Clear carbohydrate beverage such as ClearFast or Gatorade  --Black Coffee or Clear Tea (No milk, no creamers, do not add anything to the coffee or Tea)   Type 1 and type 2 diabetics should only drink water. .     __x__ 2. No Alcohol for 24 hours before or after surgery.   __x__3. No Smoking for 24 prior to surgery.   ____  4. Bring all medications with you on the day of surgery if instructed.    __x__ 5. Notify your doctor if there is any change in your medical condition     (cold, fever, infections).     Do not wear jewelry, make-up, hairpins, clips or nail polish.  Do not wear lotions, powders, or perfumes. You may wear deodorant.  Do not shave 48 hours prior to surgery. Men may shave face and neck.  Do not bring valuables to the hospital.    [facility] is not responsible for any belongings or valuables.               Contacts, dentures or bridgework may not be worn into surgery.  Leave your suitcase in the car. After surgery it may be brought to your room.  For patients admitted to the hospital, discharge time is determined  by your   treatment team.   Patients discharged the day of surgery will not be allowed to drive home.  You will need someone to drive you home and stay with you the night of your procedure.    Please read over the following fact sheets that you were given:     _x___ [place] WITH A SMALL SIP OF WATER. These include:  1. LEVOTHYROXINE  2.  3.  4.  5.  6.  ____Fleets enema or Magnesium Citrate as directed.   ____ Use CHG Soap or sage wipes as directed on instruction sheet   ____ Use inhalers on the day of surgery and bring to hospital day of surgery  ____ Stop Metformin and Janumet 2 days prior to surgery.    ____ Take 1/2 of usual insulin dose the night before surgery and none on the morning surgery.   _x___ Follow recommendations from Cardiologist, Pulmonologist or PCP regardingstopping Aspirin, Coumadin, Plavix ,Eliquis, Effient, or Pradaxa, and Pletal-OK TO CONTINUE 81 MG ASPIRIN-DO NOT TAKE AM OF SURGERY  ____Stop Anti-inflammatories such as Advil, Aleve, Ibuprofen, Motrin, Naproxen, Naprosyn, Goodies powders or aspirin products. OK to take Tylenol    _x___ Stop supplements until after surgery-STOP OSTEO BI-FLEX AND TART CHERRY NOW-MAY RESUME AFTER SURGERY  ____ Bring C-Pap to the hospital.

## 2016-12-19 ENCOUNTER — Ambulatory Visit
Admission: RE | Admit: 2016-12-19 | Discharge: 2016-12-19 | Disposition: A | Discharge status: Home / Self Care | Payer: BLUE CROSS/BLUE SHIELD | Source: Ambulatory Visit | Attending: General Surgery | Admitting: General Surgery

## 2016-12-19 ENCOUNTER — Encounter
Admission: RE | Disposition: A | Discharge status: Home / Self Care | Payer: Self-pay | Source: Ambulatory Visit | Attending: General Surgery

## 2016-12-19 ENCOUNTER — Encounter: Payer: Self-pay | Admitting: Anesthesiology

## 2016-12-19 ENCOUNTER — Ambulatory Visit: Payer: BLUE CROSS/BLUE SHIELD | Admitting: Anesthesiology

## 2016-12-19 DIAGNOSIS — N6452 Nipple discharge: Secondary | ICD-10-CM | POA: Insufficient documentation

## 2016-12-19 DIAGNOSIS — E785 Hyperlipidemia, unspecified: Secondary | ICD-10-CM | POA: Diagnosis not present

## 2016-12-19 DIAGNOSIS — E039 Hypothyroidism, unspecified: Secondary | ICD-10-CM | POA: Diagnosis not present

## 2016-12-19 DIAGNOSIS — I10 Essential (primary) hypertension: Secondary | ICD-10-CM | POA: Insufficient documentation

## 2016-12-19 DIAGNOSIS — Z79899 Other long term (current) drug therapy: Secondary | ICD-10-CM | POA: Insufficient documentation

## 2016-12-19 DIAGNOSIS — M109 Gout, unspecified: Secondary | ICD-10-CM | POA: Insufficient documentation

## 2016-12-19 DIAGNOSIS — K219 Gastro-esophageal reflux disease without esophagitis: Secondary | ICD-10-CM | POA: Diagnosis not present

## 2016-12-19 DIAGNOSIS — D241 Benign neoplasm of right breast: Secondary | ICD-10-CM | POA: Diagnosis not present

## 2016-12-19 HISTORY — PX: BREAST LUMPECTOMY: SHX2

## 2016-12-19 SURGERY — BREAST LUMPECTOMY
Anesthesia: General | Laterality: Right

## 2016-12-19 MED ORDER — ONDANSETRON HCL 4 MG/2ML IJ SOLN: 4.0000 mg | Freq: Once | INTRAMUSCULAR | Status: DC | PRN

## 2016-12-19 MED ORDER — ONDANSETRON HCL 4 MG/2ML IJ SOLN
INTRAMUSCULAR | Status: DC | PRN
  Administered 2016-12-19: 4 mg via INTRAVENOUS

## 2016-12-19 MED ORDER — FENTANYL CITRATE (PF) 100 MCG/2ML IJ SOLN
INTRAMUSCULAR | Status: AC
  Filled 2016-12-19: qty 2

## 2016-12-19 MED ORDER — PHENYLEPHRINE HCL 10 MG/ML IJ SOLN
INTRAMUSCULAR | Status: DC | PRN
  Administered 2016-12-19: 50 ug via INTRAVENOUS

## 2016-12-19 MED ORDER — FENTANYL CITRATE (PF) 100 MCG/2ML IJ SOLN
INTRAMUSCULAR | Status: AC
  Administered 2016-12-19: 25 ug via INTRAVENOUS
  Filled 2016-12-19: qty 2

## 2016-12-19 MED ORDER — FAMOTIDINE 20 MG PO TABS
20.0000 mg | ORAL_TABLET | Freq: Once | ORAL | Status: AC
  Administered 2016-12-19: 20 mg via ORAL

## 2016-12-19 MED ORDER — FENTANYL CITRATE (PF) 100 MCG/2ML IJ SOLN
25.0000 ug | INTRAMUSCULAR | Status: DC | PRN
  Administered 2016-12-19 (×2): 25 ug via INTRAVENOUS

## 2016-12-19 MED ORDER — STERILE WATER FOR IRRIGATION IR SOLN
Status: DC | PRN
  Administered 2016-12-19: 1

## 2016-12-19 MED ORDER — ONDANSETRON HCL 4 MG/2ML IJ SOLN
INTRAMUSCULAR | Status: AC
  Filled 2016-12-19: qty 2

## 2016-12-19 MED ORDER — KETOROLAC TROMETHAMINE 30 MG/ML IJ SOLN
INTRAMUSCULAR | Status: AC
Start: 2016-12-19 — End: 2016-12-19
  Filled 2016-12-19: qty 1

## 2016-12-19 MED ORDER — LIDOCAINE HCL (CARDIAC) 20 MG/ML IV SOLN
INTRAVENOUS | Status: DC | PRN
  Administered 2016-12-19: 100 mg via INTRAVENOUS

## 2016-12-19 MED ORDER — FAMOTIDINE 20 MG PO TABS
ORAL_TABLET | ORAL | Status: AC
  Administered 2016-12-19: 20 mg via ORAL
  Filled 2016-12-19: qty 1

## 2016-12-19 MED ORDER — DEXAMETHASONE SODIUM PHOSPHATE 10 MG/ML IJ SOLN
INTRAMUSCULAR | Status: DC | PRN
  Administered 2016-12-19: 5 mg via INTRAVENOUS

## 2016-12-19 MED ORDER — SUCCINYLCHOLINE CHLORIDE 20 MG/ML IJ SOLN
INTRAMUSCULAR | Status: DC | PRN
  Administered 2016-12-19: 100 mg via INTRAVENOUS

## 2016-12-19 MED ORDER — HYDROCODONE-ACETAMINOPHEN 5-325 MG PO TABS: 1.0000 | ORAL_TABLET | ORAL | 0 refills | Status: DC | PRN

## 2016-12-19 MED ORDER — MIDAZOLAM HCL 2 MG/2ML IJ SOLN
INTRAMUSCULAR | Status: DC | PRN
  Administered 2016-12-19: 2 mg via INTRAVENOUS

## 2016-12-19 MED ORDER — PROPOFOL 10 MG/ML IV BOLUS
INTRAVENOUS | Status: AC
  Filled 2016-12-19: qty 20

## 2016-12-19 MED ORDER — FENTANYL CITRATE (PF) 100 MCG/2ML IJ SOLN
INTRAMUSCULAR | Status: DC | PRN
  Administered 2016-12-19 (×2): 50 ug via INTRAVENOUS

## 2016-12-19 MED ORDER — BUPIVACAINE-EPINEPHRINE (PF) 0.5% -1:200000 IJ SOLN
INTRAMUSCULAR | Status: DC | PRN
  Administered 2016-12-19: 30 mL

## 2016-12-19 MED ORDER — PROPOFOL 10 MG/ML IV BOLUS
INTRAVENOUS | Status: DC | PRN
  Administered 2016-12-19: 150 mg via INTRAVENOUS

## 2016-12-19 MED ORDER — BUPIVACAINE-EPINEPHRINE (PF) 0.5% -1:200000 IJ SOLN
INTRAMUSCULAR | Status: AC
  Filled 2016-12-19: qty 30

## 2016-12-19 MED ORDER — MIDAZOLAM HCL 2 MG/2ML IJ SOLN
INTRAMUSCULAR | Status: AC
Start: 2016-12-19 — End: 2016-12-19
  Filled 2016-12-19: qty 2

## 2016-12-19 MED ORDER — ACETAMINOPHEN 10 MG/ML IV SOLN
INTRAVENOUS | Status: AC
  Filled 2016-12-19: qty 100

## 2016-12-19 MED ORDER — LACTATED RINGERS IV SOLN
INTRAVENOUS | Status: DC
  Administered 2016-12-19 (×2): via INTRAVENOUS

## 2016-12-19 SURGICAL SUPPLY — 46 items
BLADE SURG 15 STRL SS SAFETY (BLADE) ×6 IMPLANT
BRA SURGICAL XLRG (MISCELLANEOUS) ×3 IMPLANT
BULB RESERV EVAC DRAIN JP 100C (MISCELLANEOUS) IMPLANT
CANISTER SUCT 1200ML W/VALVE (MISCELLANEOUS) ×3 IMPLANT
CHLORAPREP W/TINT 26ML (MISCELLANEOUS) ×3 IMPLANT
CLOSURE WOUND 1/2 X4 (GAUZE/BANDAGES/DRESSINGS) ×1
CNTNR SPEC 2.5X3XGRAD LEK (MISCELLANEOUS)
CONT SPEC 4OZ STER OR WHT (MISCELLANEOUS)
CONTAINER SPEC 2.5X3XGRAD LEK (MISCELLANEOUS) IMPLANT
COVER PROBE FLX POLY STRL (MISCELLANEOUS) ×3 IMPLANT
DEVICE DUBIN SPECIMEN MAMMOGRA (MISCELLANEOUS) ×3 IMPLANT
DRAIN CHANNEL JP 15F RND 16 (MISCELLANEOUS) IMPLANT
DRAPE LAPAROTOMY TRNSV 106X77 (MISCELLANEOUS) ×3 IMPLANT
DRSG TELFA 3X8 NADH (GAUZE/BANDAGES/DRESSINGS) ×3 IMPLANT
ELECT CAUTERY BLADE TIP 2.5 (TIP) ×3
ELECT REM PT RETURN 9FT ADLT (ELECTROSURGICAL) ×3
ELECTRODE CAUTERY BLDE TIP 2.5 (TIP) ×1 IMPLANT
ELECTRODE REM PT RTRN 9FT ADLT (ELECTROSURGICAL) ×1 IMPLANT
GAUZE FLUFF 18X24 1PLY STRL (GAUZE/BANDAGES/DRESSINGS) ×3 IMPLANT
GLOVE BIO SURGEON STRL SZ7.5 (GLOVE) ×3 IMPLANT
GLOVE INDICATOR 8.0 STRL GRN (GLOVE) ×3 IMPLANT
GOWN STRL REUS W/ TWL LRG LVL3 (GOWN DISPOSABLE) ×2 IMPLANT
GOWN STRL REUS W/TWL LRG LVL3 (GOWN DISPOSABLE) ×4
KIT RM TURNOVER STRD PROC AR (KITS) ×3 IMPLANT
LABEL OR SOLS (LABEL) IMPLANT
MARGIN MAP 10MM (MISCELLANEOUS) ×3 IMPLANT
NDL SAFETY 22GX1.5 (NEEDLE) ×3 IMPLANT
NEEDLE HYPO 25X1 1.5 SAFETY (NEEDLE) ×6 IMPLANT
PACK BASIN MINOR ARMC (MISCELLANEOUS) ×3 IMPLANT
SHEARS FOC LG CVD HARMONIC 17C (MISCELLANEOUS) IMPLANT
SHEARS HARMONIC 9CM CVD (BLADE) IMPLANT
STRIP CLOSURE SKIN 1/2X4 (GAUZE/BANDAGES/DRESSINGS) ×2 IMPLANT
SUT ETHILON 3-0 FS-10 30 BLK (SUTURE) ×3
SUT SILK 2 0 (SUTURE) ×2
SUT SILK 2-0 18XBRD TIE 12 (SUTURE) ×1 IMPLANT
SUT VIC AB 2-0 CT1 27 (SUTURE) ×4
SUT VIC AB 2-0 CT1 TAPERPNT 27 (SUTURE) ×2 IMPLANT
SUT VIC AB 4-0 FS2 27 (SUTURE) ×3 IMPLANT
SUT VICRYL+ 3-0 144IN (SUTURE) ×3 IMPLANT
SUTURE EHLN 3-0 FS-10 30 BLK (SUTURE) ×1 IMPLANT
SWABSTK COMLB BENZOIN TINCTURE (MISCELLANEOUS) ×3 IMPLANT
SYR BULB IRRIG 60ML STRL (SYRINGE) ×3 IMPLANT
SYR CONTROL 10ML (SYRINGE) ×3 IMPLANT
SYRINGE 10CC LL (SYRINGE) IMPLANT
TAPE TRANSPORE STRL 2 31045 (GAUZE/BANDAGES/DRESSINGS) IMPLANT
WATER STERILE IRR 1000ML POUR (IV SOLUTION) ×3 IMPLANT

## 2016-12-19 NOTE — Anesthesia Post-op Follow-up Note (Signed)
Anesthesia QCDR form completed.        

## 2016-12-19 NOTE — Anesthesia Postprocedure Evaluation (Signed)
Anesthesia Post Note  Patient: Lindsey Todd  Procedure(s) Performed: EXCISION RIGHT BREAST RETROAREOLAR PAPILLOMA (Right )  Patient location during evaluation: PACU Anesthesia Type: General Level of consciousness: awake and alert and oriented Pain management: pain level controlled Vital Signs Assessment: post-procedure vital signs reviewed and stable Respiratory status: spontaneous breathing Cardiovascular status: blood pressure returned to baseline Anesthetic complications: no     Last Vitals:  Vitals:   12/19/16 1100 12/19/16 1108  BP: 124/64 123/63  Pulse: 78 84  Resp: 10 16  Temp: 36.8 C 36.8 C  SpO2: 95% 97%    Last Pain:  Vitals:   12/19/16 1108  TempSrc: Temporal  PainSc:                  Lindsey Todd

## 2016-12-19 NOTE — H&P (Signed)
No change in clinical history or exam.  

## 2016-12-19 NOTE — Anesthesia Preprocedure Evaluation (Signed)
Anesthesia Evaluation  Patient identified by MRN, date of birth, ID band Patient awake    Reviewed: Allergy & Precautions, H&P , NPO status , Patient's Chart, lab work & pertinent test results  History of Anesthesia Complications (+) PONV and history of anesthetic complications  Airway Mallampati: III  TM Distance: >3 FB Neck ROM: full    Dental  (+) Caps, Chipped   Pulmonary neg pulmonary ROS, neg shortness of breath,           Cardiovascular Exercise Tolerance: Good hypertension, (-) angina(-) Past MI and (-) DOE      Neuro/Psych negative neurological ROS  negative psych ROS   GI/Hepatic Neg liver ROS, GERD  Medicated and Controlled,  Endo/Other  Hypothyroidism   Renal/GU negative Renal ROS  negative genitourinary   Musculoskeletal   Abdominal   Peds negative pediatric ROS (+)  Hematology negative hematology ROS (+) anemia ,   Anesthesia Other Findings Past Medical History: No date: Anemia No date: Elevated LDL cholesterol level No date: Gout No date: Gout No date: Hyperlipidemia No date: Hypertension No date: Hypothyroidism No date: Iron deficiency No date: PONV (postoperative nausea and vomiting) No date: Thyroid disease No date: Vitamin B 12 deficiency  Past Surgical History: 07/2007: ABDOMINAL HYSTERECTOMY 2006: BREAST BIOPSY; Right     Comment:  benign 06/17/2015: BREAST BIOPSY; Right     Comment:  right breast biopsy path pending  No date: COLONOSCOPY 10/2015: EYE SURGERY; Bilateral No date: TOTAL ABDOMINAL HYSTERECTOMY W/ BILATERAL SALPINGOOPHORECTOMY  BMI    Body Mass Index:  38.01 kg/m      Reproductive/Obstetrics negative OB ROS                             Anesthesia Physical  Anesthesia Plan  ASA: III  Anesthesia Plan: General   Post-op Pain Management:    Induction: Intravenous  PONV Risk Score and Plan:   Airway Management Planned: Oral  ETT  Additional Equipment:   Intra-op Plan:   Post-operative Plan: Extubation in OR  Informed Consent: I have reviewed the patients History and Physical, chart, labs and discussed the procedure including the risks, benefits and alternatives for the proposed anesthesia with the patient or authorized representative who has indicated his/her understanding and acceptance.   Dental Advisory Given  Plan Discussed with: Anesthesiologist, CRNA and Surgeon  Anesthesia Plan Comments: (Patient consented for risks of anesthesia including but not limited to:  - adverse reactions to medications - risk of intubation if required - damage to teeth, lips or other oral mucosa - sore throat or hoarseness - Damage to heart, brain, lungs or loss of life  Patient voiced understanding.)        Anesthesia Quick Evaluation

## 2016-12-19 NOTE — Op Note (Signed)
Preoperative diagnosis: Papilloma right breast, persistent nipple drainage postbiopsy.  Postoperative diagnosis: Same.  Operative procedure: Excision of right breast retro-areolar papilloma with ultrasound guidance.  Operating surgeon: Ollen Bowl, M.D.  Anesthesia: Gen. Endotracheal. Marcaine 0.5% with 1-200,000 epinephrine, 30 mL.  Clinical note: This 66 year old woman had an abnormality noted on mammography and subsequently underwent a core biopsy by the radiology she has had persistent nipple drainage. Given the options of complete excision with vacuum biopsy versus surgical excision she has chosen the latter.  Operative note: The patient underwent general endotracheal anesthesia without difficulty. Ultrasound was used to identify the area at the 9:00 position just at the junction of the areola with the remaining breast skin. Marcaine was infiltrated for postoperative analgesia. The draining duct was cannulated with a lacrimal duct probe and the skin incised sharply. The remaining dissection was made with Electrocautery. The entire ductal structure extending to the base the nipple was freed circumferentially and then a 2.5 centimeter diameter, 5 cm long core was excised, skin marker placed at the duct excision site and a cranial marker placed. Specimen radiograph confirmed the previously placed clip w figure-of-eight sutures. The skin was approximated with interrupted 4-0 Vicryl subcuticular sutures.Benzoin Steri-Strips were applied. Telfa gauze, fluff gauze and a surgical bra were placed.  The patient tolerated the procedure well and was taken to the recovery room in stable condition.

## 2016-12-19 NOTE — Anesthesia Procedure Notes (Signed)
Procedure Name: Intubation Date/Time: 12/19/2016 9:16 AM Performed by: Demetrius Charity Pre-anesthesia Checklist: Patient identified, Patient being monitored, Timeout performed, Emergency Drugs available and Suction available Patient Re-evaluated:Patient Re-evaluated prior to induction Oxygen Delivery Method: Circle system utilized Preoxygenation: Pre-oxygenation with 100% oxygen Induction Type: IV induction Ventilation: Mask ventilation without difficulty Laryngoscope Size: Mac and 3 Grade View: Grade I Tube type: Oral Tube size: 7.0 mm Number of attempts: 1 Airway Equipment and Method: Stylet Placement Confirmation: ETT inserted through vocal cords under direct vision,  positive ETCO2 and breath sounds checked- equal and bilateral Secured at: 22 (teeth) cm Tube secured with: Tape Dental Injury: Teeth and Oropharynx as per pre-operative assessment

## 2016-12-19 NOTE — Discharge Instructions (Signed)

## 2016-12-19 NOTE — Transfer of Care (Signed)
Immediate Anesthesia Transfer of Care Note  Patient: Lindsey Todd  Procedure(s) Performed: EXCISION RIGHT BREAST RETROAREOLAR PAPILLOMA (Right )  Patient Location: PACU  Anesthesia Type:General  Level of Consciousness: sedated  Airway & Oxygen Therapy: Patient Spontanous Breathing  Post-op Assessment: VSS  Post vital signs: stable  Last Vitals:  Vitals:   12/19/16 0739 12/19/16 1015  BP: (!) 149/81 134/76  Pulse: 79 (!) 105  Resp: 14 (!) 28  Temp: 36.8 C 36.4 C  SpO2: 96% 97%    Last Pain:  Vitals:   12/19/16 1015  TempSrc:   PainSc: 0-No pain         Complications: No apparent anesthesia complications

## 2016-12-20 ENCOUNTER — Telehealth: Payer: Self-pay | Admitting: General Surgery

## 2016-12-20 LAB — SURGICAL PATHOLOGY

## 2016-12-20 NOTE — Telephone Encounter (Signed)
Message left on both home and cell numbers that pathology was benign. No change from original biopsy of earlier this year. Follow-up is planned.

## 2016-12-26 ENCOUNTER — Ambulatory Visit (INDEPENDENT_AMBULATORY_CARE_PROVIDER_SITE_OTHER): Payer: BLUE CROSS/BLUE SHIELD | Admitting: General Surgery

## 2016-12-26 ENCOUNTER — Encounter: Payer: Self-pay | Admitting: General Surgery

## 2016-12-26 VITALS — BP 144/88 | HR 82 | Resp 14 | Ht 67.0 in | Wt 253.0 lb

## 2016-12-26 DIAGNOSIS — D241 Benign neoplasm of right breast: Secondary | ICD-10-CM

## 2016-12-26 NOTE — Progress Notes (Signed)
Patient ID: Lindsey Todd, female   DOB: 12-25-1950, 66 y.o.   MRN: [id number]  Chief Complaint  Patient presents with  . Routine Post Op    HPI Lindsey Todd is a 66 y.o. female here for follow up for right breast excision done on 12/19/16. She reports is doing well with very little pain. She does reports some itching and stinging at times.  HPI  Past Medical History:  Diagnosis Date  . Anemia   . Elevated LDL cholesterol level   . Gout   . Gout   . Hyperlipidemia   . Hypertension   . Hypothyroidism   . Iron deficiency   . PONV (postoperative nausea and vomiting)   . Thyroid disease   . Vitamin B 12 deficiency     Past Surgical History:  Procedure Laterality Date  . ABDOMINAL HYSTERECTOMY  07/2007  . BREAST BIOPSY Right 2006   benign  . BREAST BIOPSY Right 06/17/2015   right breast biopsy papilloma  . BREAST LUMPECTOMY Right 12/19/2016   papilloma excised  . BREAST LUMPECTOMY Right 12/19/2016   Procedure: EXCISION RIGHT BREAST RETROAREOLAR PAPILLOMA;  Surgeon: Robert Bellow, MD;  Location: [facility] ORS;  Service: General;  Laterality: Right;  . COLONOSCOPY    . COLONOSCOPY WITH PROPOFOL N/A 09/16/2016   Procedure: COLONOSCOPY WITH PROPOFOL;  Surgeon: Manya Silvas, MD;  Location: [facility] ENDOSCOPY;  Service: Endoscopy;  Laterality: N/A;  . ESOPHAGOGASTRODUODENOSCOPY (EGD) WITH PROPOFOL N/A 09/16/2016   Procedure: ESOPHAGOGASTRODUODENOSCOPY (EGD) WITH PROPOFOL;  Surgeon: Manya Silvas, MD;  Location: [facility] ENDOSCOPY;  Service: Endoscopy;  Laterality: N/A;  . EYE SURGERY Bilateral 10/2015  . TOTAL ABDOMINAL HYSTERECTOMY W/ BILATERAL SALPINGOOPHORECTOMY      No family history on file.  Social History Social History  Substance Use Topics  . Smoking status: Never Smoker  . Smokeless tobacco: Never Used  . Alcohol use No    No Known Allergies  Current Outpatient Prescriptions  Medication Sig Dispense Refill  . allopurinol (ZYLOPRIM) 100 MG tablet One by  mouth daily to prevent gout    . aspirin EC 81 MG tablet Take 81 mg by mouth daily.    Marland Kitchen atorvastatin (LIPITOR) 20 MG tablet Take 20 mg by mouth daily at 6 PM.     . Iron-Vitamin C (IRON 100/C PO) Take by mouth.    . levothyroxine (SYNTHROID, LEVOTHROID) 25 MCG tablet Take 25 mcg by mouth daily before breakfast.   0  . losartan (COZAAR) 25 MG tablet Take 25 mg by mouth every morning.     . Misc Natural Products (OSTEO BI-FLEX ADV TRIPLE ST) TABS Take 1 tablet by mouth 2 (two) times daily.    . Misc Natural Products (TART CHERRY ADVANCED PO) Take 1 capsule by mouth 2 (two) times daily.    . vitamin B-12 (CYANOCOBALAMIN) 100 MCG tablet Take 100 mcg by mouth daily.    . vitamin C (ASCORBIC ACID) 500 MG tablet Take 500 mg by mouth daily.      No current facility-administered medications for this visit.     Review of Systems Review of Systems  Constitutional: Negative.   Respiratory: Negative.   Cardiovascular: Negative.     Blood pressure (!) 144/88, pulse 82, resp. rate 14, height 5\' 7"  (1.702 m), weight 253 lb (114.8 kg).  Physical Exam Physical Exam  Constitutional: She is oriented to person, place, and time. She appears well-developed and well-nourished.  Pulmonary/Chest:    Well healing right breast excision site  Neurological: She is alert and oriented to person, place, and time.  Skin: Skin is warm and dry.  Psychiatric: She has a normal mood and affect.    Data Reviewed DIAGNOSIS:  A. BREAST, RIGHT 9:00; EXCISION:  - INTRADUCTAL PAPILLOMA WITH SCLEROSIS AND CALCIFICATION.  - PREVIOUS BIOPSY SITE CHANGE.  - NEGATIVE FOR ATYPIA AND MALIGNANCY.  - MARGINS ARE NEGATIVE.    Assessment    Doing well s/p papilloma excision.     Plan    Follow up in one month. May use heat for comfort. Wear a bra day and night until the heaviness is gone.      HPI, Physical Exam, Assessment and Plan have been scribed under the direction and in the presence of Robert Bellow,  MD  [name], LPN  I have completed the exam and reviewed the above documentation for accuracy and completeness.  I agree with the above.  Haematologist has been used and any errors in dictation or transcription are unintentional.  Hervey Ard, M.D., F.A.C.S.  Robert Bellow 12/27/2016, 7:16 AM

## 2016-12-26 NOTE — Patient Instructions (Addendum)
Follow up in one month. May use heat for comfort. Wear a bra day and night until the heaviness is gone.

## 2017-01-25 ENCOUNTER — Encounter: Payer: Self-pay | Admitting: General Surgery

## 2017-01-25 ENCOUNTER — Ambulatory Visit (INDEPENDENT_AMBULATORY_CARE_PROVIDER_SITE_OTHER): Payer: BLUE CROSS/BLUE SHIELD | Admitting: General Surgery

## 2017-01-25 VITALS — BP 132/74 | HR 88 | Resp 14 | Ht 67.0 in | Wt 237.0 lb

## 2017-01-25 DIAGNOSIS — D241 Benign neoplasm of right breast: Secondary | ICD-10-CM

## 2017-01-25 NOTE — Progress Notes (Signed)
Patient ID: Lindsey Todd, female   DOB: October 06, 1950, 66 y.o.   MRN: [id number]  Chief Complaint  Patient presents with  . Follow-up    HPI Lindsey Todd is a 66 y.o. female female here for follow up for right breast excision done on 12/19/16. She reports is doing well  HPI  Past Medical History:  Diagnosis Date  . Anemia   . Elevated LDL cholesterol level   . Gout   . Gout   . Hyperlipidemia   . Hypertension   . Hypothyroidism   . Iron deficiency   . PONV (postoperative nausea and vomiting)   . Thyroid disease   . Vitamin B 12 deficiency     Past Surgical History:  Procedure Laterality Date  . ABDOMINAL HYSTERECTOMY  07/2007  . BREAST BIOPSY Right 2006   benign  . BREAST BIOPSY Right 06/17/2015   right breast biopsy papilloma  . BREAST LUMPECTOMY Right 12/19/2016   papilloma excised  . BREAST LUMPECTOMY Right 12/19/2016   Procedure: EXCISION RIGHT BREAST RETROAREOLAR PAPILLOMA;  Surgeon: Robert Bellow, MD;  Location: [facility] ORS;  Service: General;  Laterality: Right;  . COLONOSCOPY    . COLONOSCOPY WITH PROPOFOL N/A 09/16/2016   Procedure: COLONOSCOPY WITH PROPOFOL;  Surgeon: Manya Silvas, MD;  Location: [facility] ENDOSCOPY;  Service: Endoscopy;  Laterality: N/A;  . ESOPHAGOGASTRODUODENOSCOPY (EGD) WITH PROPOFOL N/A 09/16/2016   Procedure: ESOPHAGOGASTRODUODENOSCOPY (EGD) WITH PROPOFOL;  Surgeon: Manya Silvas, MD;  Location: [facility] ENDOSCOPY;  Service: Endoscopy;  Laterality: N/A;  . EYE SURGERY Bilateral 10/2015  . TOTAL ABDOMINAL HYSTERECTOMY W/ BILATERAL SALPINGOOPHORECTOMY      No family history on file.  Social History Social History   Tobacco Use  . Smoking status: Never Smoker  . Smokeless tobacco: Never Used  Substance Use Topics  . Alcohol use: No  . Drug use: No    No Known Allergies  Current Outpatient Medications  Medication Sig Dispense Refill  . allopurinol (ZYLOPRIM) 100 MG tablet One by mouth daily to prevent gout    . aspirin EC  81 MG tablet Take 81 mg by mouth daily.    Marland Kitchen atorvastatin (LIPITOR) 20 MG tablet Take 20 mg by mouth daily at 6 PM.     . Iron-Vitamin C (IRON 100/C PO) Take by mouth.    . levothyroxine (SYNTHROID, LEVOTHROID) 25 MCG tablet Take 25 mcg by mouth daily before breakfast.   0  . losartan (COZAAR) 25 MG tablet Take 25 mg by mouth every morning.     . Misc Natural Products (OSTEO BI-FLEX ADV TRIPLE ST) TABS Take 1 tablet by mouth 2 (two) times daily.    . Misc Natural Products (TART CHERRY ADVANCED PO) Take 1 capsule by mouth 2 (two) times daily.    . vitamin B-12 (CYANOCOBALAMIN) 100 MCG tablet Take 100 mcg by mouth daily.    . vitamin C (ASCORBIC ACID) 500 MG tablet Take 500 mg by mouth daily.      No current facility-administered medications for this visit.     Review of Systems Review of Systems  Blood pressure 132/74, pulse 88, resp. rate 14, height 5\' 7"  (1.702 m), weight 237 lb (107.5 kg).  Physical Exam Physical Exam  Constitutional: She is oriented to person, place, and time. She appears well-developed and well-nourished.  Cardiovascular: Normal rate, regular rhythm and normal heart sounds.  Pulmonary/Chest: Effort normal and breath sounds normal. Right breast exhibits no inverted nipple, no mass, no nipple discharge, no skin  change and no tenderness.    Right breast incision is clean and healing well.   Neurological: She is alert and oriented to person, place, and time.  Skin: Skin is warm and dry.    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  Assessment    Doing well status post formal excision of intraductal papilloma.    Plan     The patient should resume screening mammograms with your PCP next year.  She is welcome to return at any time if new breast issues develop.   Patient to return as needed. The patient is aware to call back for any questions or concerns.   HPI, Physical Exam, Assessment and Plan have been scribed under the  direction and in the presence of Hervey Ard, MD.  Gaspar Cola, CMA  I have completed the exam and reviewed the above documentation for accuracy and completeness.  I agree with the above.  Haematologist has been used and any errors in dictation or transcription are unintentional.  Hervey Ard, M.D., F.A.C.S.   Robert Bellow 01/26/2017, 3:43 PM

## 2017-01-25 NOTE — Patient Instructions (Signed)
Patient to return as needed. The patient is aware to call back for any questions or concerns. 

## 2017-05-02 ENCOUNTER — Other Ambulatory Visit: Payer: Self-pay | Admitting: Family Medicine

## 2017-05-02 DIAGNOSIS — Z780 Asymptomatic menopausal state: Secondary | ICD-10-CM

## 2017-08-02 ENCOUNTER — Other Ambulatory Visit: Payer: Self-pay | Admitting: Nurse Practitioner

## 2017-08-02 DIAGNOSIS — K766 Portal hypertension: Principal | ICD-10-CM

## 2017-08-02 DIAGNOSIS — K3189 Other diseases of stomach and duodenum: Secondary | ICD-10-CM

## 2017-08-07 ENCOUNTER — Ambulatory Visit
Admission: RE | Admit: 2017-08-07 | Discharge: 2017-08-07 | Disposition: A | Discharge status: Home / Self Care | Payer: BLUE CROSS/BLUE SHIELD | Source: Ambulatory Visit | Attending: Nurse Practitioner | Admitting: Nurse Practitioner

## 2017-08-07 DIAGNOSIS — K8020 Calculus of gallbladder without cholecystitis without obstruction: Secondary | ICD-10-CM | POA: Diagnosis not present

## 2017-08-07 DIAGNOSIS — K766 Portal hypertension: Secondary | ICD-10-CM | POA: Diagnosis present

## 2017-08-07 DIAGNOSIS — K760 Fatty (change of) liver, not elsewhere classified: Secondary | ICD-10-CM | POA: Diagnosis not present

## 2017-08-07 DIAGNOSIS — K3189 Other diseases of stomach and duodenum: Secondary | ICD-10-CM | POA: Diagnosis present

## 2017-10-10 ENCOUNTER — Encounter: Payer: Self-pay | Admitting: *Deleted

## 2017-10-11 ENCOUNTER — Encounter
Admission: RE | Disposition: A | Discharge status: Home / Self Care | Payer: Self-pay | Source: Ambulatory Visit | Attending: Unknown Physician Specialty

## 2017-10-11 ENCOUNTER — Ambulatory Visit: Payer: BLUE CROSS/BLUE SHIELD | Admitting: Anesthesiology

## 2017-10-11 ENCOUNTER — Encounter: Payer: Self-pay | Admitting: Anesthesiology

## 2017-10-11 ENCOUNTER — Ambulatory Visit
Admission: RE | Admit: 2017-10-11 | Discharge: 2017-10-11 | Disposition: A | Discharge status: Home / Self Care | Payer: BLUE CROSS/BLUE SHIELD | Source: Ambulatory Visit | Attending: Unknown Physician Specialty | Admitting: Unknown Physician Specialty

## 2017-10-11 DIAGNOSIS — Z08 Encounter for follow-up examination after completed treatment for malignant neoplasm: Secondary | ICD-10-CM | POA: Diagnosis present

## 2017-10-11 DIAGNOSIS — K766 Portal hypertension: Secondary | ICD-10-CM | POA: Diagnosis not present

## 2017-10-11 DIAGNOSIS — Z7951 Long term (current) use of inhaled steroids: Secondary | ICD-10-CM | POA: Insufficient documentation

## 2017-10-11 DIAGNOSIS — K317 Polyp of stomach and duodenum: Secondary | ICD-10-CM | POA: Insufficient documentation

## 2017-10-11 DIAGNOSIS — Z79890 Hormone replacement therapy (postmenopausal): Secondary | ICD-10-CM | POA: Insufficient documentation

## 2017-10-11 DIAGNOSIS — E539 Vitamin B deficiency, unspecified: Secondary | ICD-10-CM | POA: Diagnosis not present

## 2017-10-11 DIAGNOSIS — Z7982 Long term (current) use of aspirin: Secondary | ICD-10-CM | POA: Diagnosis not present

## 2017-10-11 DIAGNOSIS — I10 Essential (primary) hypertension: Secondary | ICD-10-CM | POA: Insufficient documentation

## 2017-10-11 DIAGNOSIS — Z79899 Other long term (current) drug therapy: Secondary | ICD-10-CM | POA: Insufficient documentation

## 2017-10-11 DIAGNOSIS — E039 Hypothyroidism, unspecified: Secondary | ICD-10-CM | POA: Diagnosis not present

## 2017-10-11 DIAGNOSIS — K219 Gastro-esophageal reflux disease without esophagitis: Secondary | ICD-10-CM | POA: Insufficient documentation

## 2017-10-11 HISTORY — PX: ESOPHAGOGASTRODUODENOSCOPY (EGD) WITH PROPOFOL: SHX5813

## 2017-10-11 HISTORY — DX: Dermatitis, unspecified: L30.9

## 2017-10-11 SURGERY — ESOPHAGOGASTRODUODENOSCOPY (EGD) WITH PROPOFOL
Anesthesia: General

## 2017-10-11 MED ORDER — PROPOFOL 10 MG/ML IV BOLUS
INTRAVENOUS | Status: DC | PRN
  Administered 2017-10-11: 20 mg via INTRAVENOUS
  Administered 2017-10-11: 30 mg via INTRAVENOUS

## 2017-10-11 MED ORDER — SODIUM CHLORIDE 0.9 % IV SOLN
INTRAVENOUS | Status: DC
  Administered 2017-10-11: 1000 mL via INTRAVENOUS

## 2017-10-11 MED ORDER — LIDOCAINE HCL (PF) 1 % IJ SOLN
2.0000 mL | Freq: Once | INTRAMUSCULAR | Status: AC
  Administered 2017-10-11: 0.3 mL via INTRADERMAL

## 2017-10-11 MED ORDER — GLYCOPYRROLATE 0.2 MG/ML IJ SOLN
INTRAMUSCULAR | Status: DC | PRN
  Administered 2017-10-11: 0.2 mg via INTRAVENOUS

## 2017-10-11 MED ORDER — FENTANYL CITRATE (PF) 100 MCG/2ML IJ SOLN
INTRAMUSCULAR | Status: DC | PRN
  Administered 2017-10-11 (×2): 50 ug via INTRAVENOUS

## 2017-10-11 MED ORDER — MIDAZOLAM HCL 5 MG/5ML IJ SOLN
INTRAMUSCULAR | Status: DC | PRN
  Administered 2017-10-11: 2 mg via INTRAVENOUS

## 2017-10-11 MED ORDER — LIDOCAINE HCL (PF) 1 % IJ SOLN
INTRAMUSCULAR | Status: AC
  Administered 2017-10-11: 0.3 mL via INTRADERMAL
  Filled 2017-10-11: qty 2

## 2017-10-11 MED ORDER — MIDAZOLAM HCL 2 MG/2ML IJ SOLN
INTRAMUSCULAR | Status: AC
  Filled 2017-10-11: qty 2

## 2017-10-11 MED ORDER — LIDOCAINE HCL (PF) 2 % IJ SOLN
INTRAMUSCULAR | Status: AC
  Filled 2017-10-11: qty 10

## 2017-10-11 MED ORDER — FENTANYL CITRATE (PF) 100 MCG/2ML IJ SOLN
INTRAMUSCULAR | Status: AC
  Filled 2017-10-11: qty 2

## 2017-10-11 MED ORDER — LIDOCAINE HCL (PF) 2 % IJ SOLN
INTRAMUSCULAR | Status: DC | PRN
  Administered 2017-10-11: 100 mg

## 2017-10-11 MED ORDER — PROPOFOL 500 MG/50ML IV EMUL
INTRAVENOUS | Status: DC | PRN
  Administered 2017-10-11: 50 ug/kg/min via INTRAVENOUS

## 2017-10-11 MED ORDER — GLYCOPYRROLATE 0.2 MG/ML IJ SOLN
INTRAMUSCULAR | Status: AC
  Filled 2017-10-11: qty 1

## 2017-10-11 NOTE — Anesthesia Postprocedure Evaluation (Signed)
Anesthesia Post Note  Patient: Lindsey Todd  Procedure(s) Performed: ESOPHAGOGASTRODUODENOSCOPY (EGD) WITH PROPOFOL (N/A )  Patient location during evaluation: Endoscopy Anesthesia Type: General Level of consciousness: awake and alert Pain management: pain level controlled Vital Signs Assessment: post-procedure vital signs reviewed and stable Respiratory status: spontaneous breathing, nonlabored ventilation, respiratory function stable and patient connected to nasal cannula oxygen Cardiovascular status: blood pressure returned to baseline and stable Postop Assessment: no apparent nausea or vomiting Anesthetic complications: no     Last Vitals:  Vitals:   10/11/17 1324 10/11/17 1334  BP: 138/89 (!) 148/80  Pulse: 86 77  Resp: 17 15  Temp:    SpO2: 98% 97%    Last Pain:  Vitals:   10/11/17 1334  TempSrc:   PainSc: 0-No pain                 Martha Clan

## 2017-10-11 NOTE — Anesthesia Post-op Follow-up Note (Signed)
Anesthesia QCDR form completed.        

## 2017-10-11 NOTE — Anesthesia Preprocedure Evaluation (Signed)
Anesthesia Evaluation  Patient identified by MRN, date of birth, ID band Patient awake    Reviewed: Allergy & Precautions, H&P , NPO status , Patient's Chart, lab work & pertinent test results  History of Anesthesia Complications (+) PONV and history of anesthetic complications  Airway Mallampati: III  TM Distance: >3 FB Neck ROM: full    Dental  (+) Caps, Chipped   Pulmonary neg pulmonary ROS, neg shortness of breath,           Cardiovascular Exercise Tolerance: Good hypertension, (-) angina(-) Past MI and (-) DOE      Neuro/Psych negative neurological ROS  negative psych ROS   GI/Hepatic Neg liver ROS, GERD  Medicated and Controlled,  Endo/Other  Hypothyroidism   Renal/GU negative Renal ROS  negative genitourinary   Musculoskeletal   Abdominal   Peds  Hematology negative hematology ROS (+)   Anesthesia Other Findings Past Medical History: No date: Anemia No date: Elevated LDL cholesterol level No date: Gout No date: Gout No date: Hyperlipidemia No date: Hypertension No date: Hypothyroidism No date: Iron deficiency No date: PONV (postoperative nausea and vomiting) No date: Thyroid disease No date: Vitamin B 12 deficiency  Past Surgical History: 07/2007: ABDOMINAL HYSTERECTOMY 2006: BREAST BIOPSY; Right     Comment:  benign 06/17/2015: BREAST BIOPSY; Right     Comment:  right breast biopsy path pending  No date: COLONOSCOPY 10/2015: EYE SURGERY; Bilateral No date: TOTAL ABDOMINAL HYSTERECTOMY W/ BILATERAL SALPINGOOPHORECTOMY  BMI    Body Mass Index:  38.01 kg/m      Reproductive/Obstetrics negative OB ROS                             Anesthesia Physical  Anesthesia Plan  ASA: III  Anesthesia Plan: General   Post-op Pain Management:    Induction: Intravenous  PONV Risk Score and Plan: 4 or greater and Propofol infusion and TIVA  Airway Management Planned:  Natural Airway and Nasal Cannula  Additional Equipment:   Intra-op Plan:   Post-operative Plan:   Informed Consent: I have reviewed the patients History and Physical, chart, labs and discussed the procedure including the risks, benefits and alternatives for the proposed anesthesia with the patient or authorized representative who has indicated his/her understanding and acceptance.   Dental Advisory Given  Plan Discussed with: Anesthesiologist, CRNA and Surgeon  Anesthesia Plan Comments: (Patient consented for risks of anesthesia including but not limited to:  - adverse reactions to medications - risk of intubation if required - damage to teeth, lips or other oral mucosa - sore throat or hoarseness - Damage to heart, brain, lungs or loss of life  Patient voiced understanding.)        Anesthesia Quick Evaluation

## 2017-10-11 NOTE — Transfer of Care (Signed)
Immediate Anesthesia Transfer of Care Note  Patient: Lindsey Todd  Procedure(s) Performed: ESOPHAGOGASTRODUODENOSCOPY (EGD) WITH PROPOFOL (N/A )  Patient Location: PACU  Anesthesia Type:General  Level of Consciousness: awake, alert  and oriented  Airway & Oxygen Therapy: Patient Spontanous Breathing  Post-op Assessment: Report given to RN and Post -op Vital signs reviewed and stable  Post vital signs: Reviewed and stable  Last Vitals:  Vitals Value Taken Time  BP    Temp    Pulse    Resp    SpO2      Last Pain:  Vitals:   10/11/17 1133  TempSrc: Tympanic         Complications: No apparent anesthesia complications

## 2017-10-11 NOTE — Op Note (Signed)
[facility] Gastroenterology Patient Name: Lindsey Todd Procedure Date: 10/11/2017 12:51 PM MRN: [id number] Account #: [id number] Date of Birth: Oct 09, 1950 Admit Type: Outpatient Age: 67 Room: [facility] ENDO ROOM 3 Gender: Female Note Status: Finalized Procedure:            Upper GI endoscopy Indications:          Follow up of previous duodenal polyp. Providers:            Manya Silvas, MD Referring MD:         Gayland Curry MD, MD (Referring MD) Medicines:            Propofol per Anesthesia Complications:        No immediate complications. Procedure:            Pre-Anesthesia Assessment:                       - After reviewing the risks and benefits, the patient                        was deemed in satisfactory condition to undergo the                        procedure.                       After obtaining informed consent, the endoscope was                        passed under direct vision. Throughout the procedure,                        the patient's blood pressure, pulse, and oxygen                        saturations were monitored continuously. The Endoscope                        was introduced through the mouth, and advanced to the                        duodenal bulb. The upper GI endoscopy was accomplished                        without difficulty. The patient tolerated the procedure                        well. Findings:      The examined esophagus was normal.      A single 4 mm sessile polyp was found in the duodenal bulb. The polyp       was removed with a hot snare. Resection and retrieval were complete. To       prevent bleeding after the polypectomy, one hemostatic clip was       successfully placed. There was no bleeding at the end of the procedure.      Mild portal hypertensive gastropathy was found in the gastric fundus and       in the gastric body. Impression:           - Normal esophagus.  Portal hypertensive  gastropathy.                       - A single duodenal polyp. Resected and retrieved. Clip                        was placed. Recommendation:       - Await pathology results. Manya Silvas, MD 10/11/2017 1:17:49 PM This report has been signed electronically. Number of Addenda: 0 Note Initiated On: 10/11/2017 12:51 PM      [facility]

## 2017-10-13 ENCOUNTER — Other Ambulatory Visit: Payer: Self-pay | Admitting: Family Medicine

## 2017-10-13 DIAGNOSIS — Z1231 Encounter for screening mammogram for malignant neoplasm of breast: Secondary | ICD-10-CM

## 2017-10-13 LAB — SURGICAL PATHOLOGY

## 2017-10-16 ENCOUNTER — Encounter: Payer: Self-pay | Admitting: Unknown Physician Specialty

## 2017-10-18 ENCOUNTER — Other Ambulatory Visit: Payer: Self-pay | Admitting: Nurse Practitioner

## 2017-10-18 DIAGNOSIS — K760 Fatty (change of) liver, not elsewhere classified: Secondary | ICD-10-CM

## 2017-10-18 DIAGNOSIS — K3189 Other diseases of stomach and duodenum: Secondary | ICD-10-CM

## 2017-10-18 DIAGNOSIS — K766 Portal hypertension: Principal | ICD-10-CM

## 2017-11-02 ENCOUNTER — Ambulatory Visit
Admission: RE | Admit: 2017-11-02 | Discharge: 2017-11-02 | Disposition: A | Discharge status: Home / Self Care | Payer: BLUE CROSS/BLUE SHIELD | Source: Ambulatory Visit | Attending: Family Medicine | Admitting: Family Medicine

## 2017-11-02 DIAGNOSIS — Z1231 Encounter for screening mammogram for malignant neoplasm of breast: Secondary | ICD-10-CM | POA: Insufficient documentation

## 2017-11-15 ENCOUNTER — Encounter: Payer: Self-pay | Admitting: Dietician

## 2017-11-15 ENCOUNTER — Encounter: Payer: BLUE CROSS/BLUE SHIELD | Attending: Nurse Practitioner | Admitting: Dietician

## 2017-11-15 VITALS — Ht 67.0 in | Wt 249.8 lb

## 2017-11-15 DIAGNOSIS — Z6839 Body mass index (BMI) 39.0-39.9, adult: Secondary | ICD-10-CM | POA: Diagnosis not present

## 2017-11-15 DIAGNOSIS — K760 Fatty (change of) liver, not elsewhere classified: Secondary | ICD-10-CM | POA: Diagnosis not present

## 2017-11-15 NOTE — Patient Instructions (Signed)
   Keep up your regular exercise, great job!  Gradually increase vegetables and fruits, plan to have at least one serving with each meal.   Continue to eat lean meats and lowfat dairy foods, and control portions of grains/ starches.

## 2017-11-15 NOTE — Progress Notes (Signed)
Medical Nutrition Therapy: Visit start time: 1630  end time: 1750  Assessment:  Diagnosis: fatty liver Past medical history: HTN, Gout, elevated cholesterol Psychosocial issues/ stress concerns: none  Preferred learning method:  . Auditory . Visual . Hands-on   Current weight: 249.8lbs Height: 5'7" Medications, supplements: reconciled list in medical record  Progress and evaluation: Patient reports diagnosis of fatty liver when having endoscopy and ultrasound tests due to intestinal polyps. No recent diet changes, has been working on generally healthy food choices for weight loss over the years with limited success. She voices concern about liver health, and has increased exercise since diagnosis. Husband has diabetes and patient would like to have a plan that works for both herself and her husband. Daughter and son-in-law are temporarily living with patient which makes meal prep more challenging, due to varied tastes.     Physical activity: walking on treadmill 20-25 minutes 2x daily, 7/7.   Dietary Intake:  Usual eating pattern includes 3 meals and 1-2 snacks per day. Dining out frequency: 3-5 meals per week.  Breakfast: 6:30-7am -- egg sandwich, gives crust to dogs, no butter, 1/2 pc cheese.  Snack: sometimes banana or saltines if stomach is upset Lunch: brings from home -- grilled cheese sandwich, sub with grilled chick, occasional salad tries to avoid fast foods.  Snack: none Supper: mostly chicken, limits red meats; 9/17 small portion spaghetti Snack: occasional cookie, usually none Beverages: not much water; diet soda, lightly sweetened tea.   Nutrition Care Education: Topics covered: fatty liver, weight control Basic nutrition: basic food groups, appropriate nutrient balance, general nutrition guidelines; plate method for planning balanced meals.  Weight control: benefits of weight control, determining reasonable weight goal-- benefits to liver with 3-5% weight loss;  importance of portion control and appropriate portions; importance of low fat and low sugar choices Fatty Liver: importance of weight loss, low fat and low saturated fat choices, limiting sugar and processed carbohydrate foods, including fiber, whole grains, vegetables and fruits; Mediterranean diet for liver and heart health including sample menus.   Nutritional Diagnosis:  Coats-1.4 Altered GI function As related to fatty liver.  As evidenced by medical tests and elevated BMI. -3.3 Overweight/obesity As related to history of excess calories and inactivity.  As evidenced by patient with BMI of 39, and patient report of lifestyle history.  Intervention: Instruction as noted above.    Patient is currently making healthy food choices in general and is limiting fat and sugar intake.    She has significantly increased physical activity and has noticed positive changes as a result.    She agrees to continue working on dietary improvements and has plans to further increase exercise.    No follow-up scheduled at this time; patient will schedule later if needed.   Education Materials given:  . Plate Planner with food lists . Mediterranean Diet ([place]) . Sample meal pattern/ menus . Goals/ instructions   Learner/ who was taught:  . Patient    Level of understanding: Marland Kitchen Verbalizes/ demonstrates competency   Demonstrated degree of understanding via:   Teach back Learning barriers: . None  Willingness to learn/ readiness for change: . Eager, change in progress  Monitoring and Evaluation:  Dietary intake, exercise, liver health, and body weight      follow up: prn

## 2017-12-05 ENCOUNTER — Other Ambulatory Visit: Payer: Self-pay | Admitting: Nurse Practitioner

## 2017-12-05 DIAGNOSIS — K766 Portal hypertension: Principal | ICD-10-CM

## 2017-12-05 DIAGNOSIS — K3189 Other diseases of stomach and duodenum: Secondary | ICD-10-CM

## 2017-12-06 ENCOUNTER — Ambulatory Visit: Payer: BLUE CROSS/BLUE SHIELD

## 2017-12-15 ENCOUNTER — Ambulatory Visit
Admission: RE | Admit: 2017-12-15 | Discharge: 2017-12-15 | Disposition: A | Discharge status: Home / Self Care | Payer: BLUE CROSS/BLUE SHIELD | Source: Ambulatory Visit | Attending: Nurse Practitioner | Admitting: Nurse Practitioner

## 2017-12-15 DIAGNOSIS — K766 Portal hypertension: Secondary | ICD-10-CM | POA: Diagnosis not present

## 2017-12-15 DIAGNOSIS — K3189 Other diseases of stomach and duodenum: Secondary | ICD-10-CM | POA: Insufficient documentation

## 2018-01-12 ENCOUNTER — Ambulatory Visit: Payer: BLUE CROSS/BLUE SHIELD | Admitting: Certified Registered"

## 2018-01-12 ENCOUNTER — Ambulatory Visit
Admission: RE | Admit: 2018-01-12 | Discharge: 2018-01-12 | Disposition: A | Discharge status: Home / Self Care | Payer: BLUE CROSS/BLUE SHIELD | Source: Ambulatory Visit | Attending: Unknown Physician Specialty | Admitting: Unknown Physician Specialty

## 2018-01-12 ENCOUNTER — Encounter
Admission: RE | Disposition: A | Discharge status: Home / Self Care | Payer: Self-pay | Source: Ambulatory Visit | Attending: Unknown Physician Specialty

## 2018-01-12 ENCOUNTER — Encounter: Payer: Self-pay | Admitting: *Deleted

## 2018-01-12 DIAGNOSIS — Z48815 Encounter for surgical aftercare following surgery on the digestive system: Secondary | ICD-10-CM | POA: Insufficient documentation

## 2018-01-12 DIAGNOSIS — K766 Portal hypertension: Secondary | ICD-10-CM | POA: Insufficient documentation

## 2018-01-12 DIAGNOSIS — Z7982 Long term (current) use of aspirin: Secondary | ICD-10-CM | POA: Insufficient documentation

## 2018-01-12 DIAGNOSIS — I10 Essential (primary) hypertension: Secondary | ICD-10-CM | POA: Insufficient documentation

## 2018-01-12 DIAGNOSIS — K2980 Duodenitis without bleeding: Secondary | ICD-10-CM | POA: Diagnosis not present

## 2018-01-12 DIAGNOSIS — M109 Gout, unspecified: Secondary | ICD-10-CM | POA: Diagnosis not present

## 2018-01-12 DIAGNOSIS — E039 Hypothyroidism, unspecified: Secondary | ICD-10-CM | POA: Diagnosis not present

## 2018-01-12 DIAGNOSIS — E785 Hyperlipidemia, unspecified: Secondary | ICD-10-CM | POA: Insufficient documentation

## 2018-01-12 DIAGNOSIS — K317 Polyp of stomach and duodenum: Secondary | ICD-10-CM | POA: Insufficient documentation

## 2018-01-12 HISTORY — PX: ESOPHAGOGASTRODUODENOSCOPY: SHX5428

## 2018-01-12 SURGERY — EGD (ESOPHAGOGASTRODUODENOSCOPY)
Anesthesia: General

## 2018-01-12 MED ORDER — SODIUM CHLORIDE 0.9 % IV SOLN
INTRAVENOUS | Status: DC
  Administered 2018-01-12: 1000 mL via INTRAVENOUS

## 2018-01-12 MED ORDER — SODIUM CHLORIDE 0.9 % IV SOLN: INTRAVENOUS | Status: DC

## 2018-01-12 MED ORDER — PROPOFOL 10 MG/ML IV BOLUS
INTRAVENOUS | Status: DC | PRN
  Administered 2018-01-12: 25 mg via INTRAVENOUS
  Administered 2018-01-12: 100 mg via INTRAVENOUS

## 2018-01-12 MED ORDER — PROPOFOL 500 MG/50ML IV EMUL
INTRAVENOUS | Status: AC
  Filled 2018-01-12: qty 50

## 2018-01-12 MED ORDER — LIDOCAINE HCL (CARDIAC) PF 100 MG/5ML IV SOSY
PREFILLED_SYRINGE | INTRAVENOUS | Status: DC | PRN
  Administered 2018-01-12: 25 mg via INTRAVENOUS

## 2018-01-12 MED ORDER — PROPOFOL 10 MG/ML IV BOLUS
INTRAVENOUS | Status: AC
  Filled 2018-01-12: qty 20

## 2018-01-12 MED ORDER — PROPOFOL 500 MG/50ML IV EMUL
INTRAVENOUS | Status: DC | PRN
  Administered 2018-01-12: 150 ug/kg/min via INTRAVENOUS

## 2018-01-12 NOTE — Anesthesia Post-op Follow-up Note (Signed)
Anesthesia QCDR form completed.        

## 2018-01-12 NOTE — Anesthesia Postprocedure Evaluation (Signed)
Anesthesia Post Note  Patient: Lindsey Todd  Procedure(s) Performed: ESOPHAGOGASTRODUODENOSCOPY (EGD) (N/A )  Patient location during evaluation: PACU Anesthesia Type: General Level of consciousness: awake and alert Pain management: pain level controlled Vital Signs Assessment: post-procedure vital signs reviewed and stable Respiratory status: spontaneous breathing, nonlabored ventilation and respiratory function stable Cardiovascular status: blood pressure returned to baseline and stable Postop Assessment: no apparent nausea or vomiting Anesthetic complications: no     Last Vitals:  Vitals:   01/12/18 0900 01/12/18 0910  BP: 134/83 138/87  Pulse: 80 70  Resp: 15 16  Temp:    SpO2:      Last Pain:  Vitals:   01/12/18 0910  TempSrc:   PainSc: 0-No pain                 Durenda Hurt

## 2018-01-12 NOTE — Op Note (Signed)
[facility] Gastroenterology Patient Name: Lindsey Todd Procedure Date: 01/12/2018 8:19 AM MRN: [id number] Account #: [id number] Date of Birth: 1950/05/31 Admit Type: Outpatient Age: 67 Room: [facility] ENDO ROOM 1 Gender: Female Note Status: Finalized Procedure:            Upper GI endoscopy Indications:          Follow up previous duodenal polyp 3 months ago. Providers:            Manya Silvas, MD Referring MD:         Gayland Curry MD, MD (Referring MD) Medicines:            Propofol per Anesthesia Complications:        No immediate complications. Procedure:            Pre-Anesthesia Assessment:                       - After reviewing the risks and benefits, the patient                        was deemed in satisfactory condition to undergo the                        procedure.                       After obtaining informed consent, the endoscope was                        passed under direct vision. Throughout the procedure,                        the patient's blood pressure, pulse, and oxygen                        saturations were monitored continuously. The Endoscope                        was introduced through the mouth, and advanced to the                        second part of duodenum. The upper GI endoscopy was                        accomplished without difficulty. The patient tolerated                        the procedure well. Findings:      The examined esophagus was normal. GEJ 40cm.      Mild portal hypertensive gastropathy was found in the gastric body.      A single small sessile polyp with no bleeding was found in the first       portion of the duodenum. Biopsy taken with a cold forceps for histology.       It appears to have removed the complete small polyp. Picture not taken,       polyp was tiny.      The second portion of the duodenum was normal. Impression:           - Normal esophagus.                       -  Portal hypertensive  gastropathy.                       - A single duodenal polyp. Biopsied. and removed.                       - Normal second portion of the duodenum. Recommendation:       - Await pathology results. Manya Silvas, MD 01/12/2018 8:44:44 AM This report has been signed electronically. Number of Addenda: 0 Note Initiated On: 01/12/2018 8:19 AM      [facility]

## 2018-01-12 NOTE — Transfer of Care (Signed)
Immediate Anesthesia Transfer of Care Note  Patient: Lindsey Todd  Procedure(s) Performed: ESOPHAGOGASTRODUODENOSCOPY (EGD) (N/A )  Patient Location: PACU and Endoscopy Unit  Anesthesia Type:General  Level of Consciousness: awake, alert , oriented and patient cooperative  Airway & Oxygen Therapy: Patient Spontanous Breathing  Post-op Assessment: Report given to RN, Post -op Vital signs reviewed and stable and Patient moving all extremities  Post vital signs: Reviewed and stable  Last Vitals:  Vitals Value Taken Time  BP 131/80 01/12/2018  8:40 AM  Temp    Pulse 89 01/12/2018  8:39 AM  Resp    SpO2 93 % 01/12/2018  8:39 AM  Vitals shown include unvalidated device data.  Last Pain:  Vitals:   01/12/18 0749  TempSrc: Tympanic  PainSc: 0-No pain         Complications: No apparent anesthesia complications

## 2018-01-12 NOTE — Anesthesia Preprocedure Evaluation (Signed)
Anesthesia Evaluation  Patient identified by MRN, date of birth, ID band Patient awake    Reviewed: Allergy & Precautions, H&P , NPO status , Patient's Chart, lab work & pertinent test results  History of Anesthesia Complications (+) PONV and history of anesthetic complications  Airway Mallampati: III  TM Distance: >3 FB Neck ROM: full    Dental  (+) Teeth Intact   Pulmonary neg pulmonary ROS,    breath sounds clear to auscultation       Cardiovascular hypertension,  Rhythm:regular Rate:Normal     Neuro/Psych negative neurological ROS  negative psych ROS   GI/Hepatic negative GI ROS, Neg liver ROS,   Endo/Other  Hypothyroidism   Renal/GU negative Renal ROS  negative genitourinary   Musculoskeletal   Abdominal   Peds  Hematology  (+) Blood dyscrasia, anemia ,   Anesthesia Other Findings Past Medical History: No date: Anemia No date: Eczema No date: Elevated LDL cholesterol level No date: Gout No date: Gout No date: Hyperlipidemia No date: Hypertension No date: Hypothyroidism No date: Iron deficiency No date: Iron deficiency No date: PONV (postoperative nausea and vomiting) No date: PONV (postoperative nausea and vomiting) No date: PONV (postoperative nausea and vomiting) No date: Thyroid disease No date: Vitamin B 12 deficiency  Past Surgical History: 07/2007: ABDOMINAL HYSTERECTOMY     Comment:  with oopherectomy 2006: BREAST BIOPSY; Right     Comment:  benign - stereotactic biopsy 06/17/2015: BREAST BIOPSY; Right     Comment:  right breast biopsy papilloma 12/19/2016: BREAST LUMPECTOMY; Right     Comment:  papilloma excised 12/19/2016: BREAST LUMPECTOMY; Right     Comment:  Procedure: EXCISION RIGHT BREAST RETROAREOLAR PAPILLOMA;              Surgeon: Robert Bellow, MD;  Location: [facility] ORS;                Service: General;  Laterality: Right; No date: COLONOSCOPY 09/16/2016: COLONOSCOPY  WITH PROPOFOL; N/A     Comment:  Procedure: COLONOSCOPY WITH PROPOFOL;  Surgeon: Manya Silvas, MD;  Location: [facility] ENDOSCOPY;  Service:               Endoscopy;  Laterality: N/A; 09/16/2016: ESOPHAGOGASTRODUODENOSCOPY (EGD) WITH PROPOFOL; N/A     Comment:  Procedure: ESOPHAGOGASTRODUODENOSCOPY (EGD) WITH               PROPOFOL;  Surgeon: Manya Silvas, MD;  Location:               [facility] ENDOSCOPY;  Service: Endoscopy;  Laterality: N/A; 10/11/2017: ESOPHAGOGASTRODUODENOSCOPY (EGD) WITH PROPOFOL; N/A     Comment:  Procedure: ESOPHAGOGASTRODUODENOSCOPY (EGD) WITH               PROPOFOL;  Surgeon: Manya Silvas, MD;  Location:               [facility] ENDOSCOPY;  Service: Endoscopy;  Laterality: N/A;                BMI=40 10/2015: EYE SURGERY; Bilateral No date: TOTAL ABDOMINAL HYSTERECTOMY W/ BILATERAL SALPINGOOPHORECTOMY  BMI    Body Mass Index:  36.02 kg/m      Reproductive/Obstetrics negative OB ROS                             Anesthesia Physical Anesthesia Plan  ASA: II  Anesthesia Plan:  General   Post-op Pain Management:    Induction:   PONV Risk Score and Plan: Propofol infusion and TIVA  Airway Management Planned: Natural Airway and Nasal Cannula  Additional Equipment:   Intra-op Plan:   Post-operative Plan:   Informed Consent: I have reviewed the patients History and Physical, chart, labs and discussed the procedure including the risks, benefits and alternatives for the proposed anesthesia with the patient or authorized representative who has indicated his/her understanding and acceptance.   Dental Advisory Given  Plan Discussed with: Anesthesiologist, CRNA and Surgeon  Anesthesia Plan Comments:         Anesthesia Quick Evaluation

## 2018-01-12 NOTE — H&P (Signed)
Primary Care Physician:  Gayland Curry, MD Primary Gastroenterologist:  Dr. Vira Agar  Pre-Procedure History & Physical: HPI:  Lindsey Todd is a 67 y.o. female is here for an endoscopy.   Past Medical History:  Diagnosis Date  . Anemia   . Eczema   . Elevated LDL cholesterol level   . Gout   . Gout   . Hyperlipidemia   . Hypertension   . Hypothyroidism   . Iron deficiency   . Iron deficiency   . PONV (postoperative nausea and vomiting)   . PONV (postoperative nausea and vomiting)   . PONV (postoperative nausea and vomiting)   . Thyroid disease   . Vitamin B 12 deficiency     Past Surgical History:  Procedure Laterality Date  . ABDOMINAL HYSTERECTOMY  07/2007   with oopherectomy  . BREAST BIOPSY Right 2006   benign - stereotactic biopsy  . BREAST BIOPSY Right 06/17/2015   right breast biopsy papilloma  . BREAST LUMPECTOMY Right 12/19/2016   papilloma excised  . BREAST LUMPECTOMY Right 12/19/2016   Procedure: EXCISION RIGHT BREAST RETROAREOLAR PAPILLOMA;  Surgeon: Robert Bellow, MD;  Location: [facility] ORS;  Service: General;  Laterality: Right;  . COLONOSCOPY    . COLONOSCOPY WITH PROPOFOL N/A 09/16/2016   Procedure: COLONOSCOPY WITH PROPOFOL;  Surgeon: Manya Silvas, MD;  Location: [facility] ENDOSCOPY;  Service: Endoscopy;  Laterality: N/A;  . ESOPHAGOGASTRODUODENOSCOPY (EGD) WITH PROPOFOL N/A 09/16/2016   Procedure: ESOPHAGOGASTRODUODENOSCOPY (EGD) WITH PROPOFOL;  Surgeon: Manya Silvas, MD;  Location: [facility] ENDOSCOPY;  Service: Endoscopy;  Laterality: N/A;  . ESOPHAGOGASTRODUODENOSCOPY (EGD) WITH PROPOFOL N/A 10/11/2017   Procedure: ESOPHAGOGASTRODUODENOSCOPY (EGD) WITH PROPOFOL;  Surgeon: Manya Silvas, MD;  Location: [facility] ENDOSCOPY;  Service: Endoscopy;  Laterality: N/A;  BMI=40  . EYE SURGERY Bilateral 10/2015  . TOTAL ABDOMINAL HYSTERECTOMY W/ BILATERAL SALPINGOOPHORECTOMY      Prior to Admission medications   Medication Sig Start Date End Date  Taking? Authorizing Provider  allopurinol (ZYLOPRIM) 100 MG tablet daily.  05/17/16  Yes [provider]  aspirin EC 81 MG tablet Take 81 mg by mouth daily.   Yes [provider]  atorvastatin (LIPITOR) 20 MG tablet  08/17/17  Yes [provider]  ferrous sulfate 325 (65 FE) MG tablet Take by mouth.   Yes [provider]  fluticasone (FLONASE) 50 MCG/ACT nasal spray Place into both nostrils daily.   Yes [provider]  Glucosamine-Chondroitin (GLUCOSAMINE CHONDR COMPLEX PO) Take by mouth.   Yes [provider]  levocetirizine (XYZAL) 5 MG tablet  11/14/17  Yes [provider]  levothyroxine (SYNTHROID, LEVOTHROID) 25 MCG tablet Take 25 mcg by mouth daily before breakfast.  05/13/16  Yes [provider]  losartan (COZAAR) 25 MG tablet Take 25 mg by mouth every morning.    Yes [provider]  Misc Natural Products (OSTEO BI-FLEX ADV TRIPLE ST) TABS Take 1 tablet by mouth 2 (two) times daily.   Yes [provider]  Misc Natural Products (TART CHERRY ADVANCED PO) Take 1 capsule by mouth 2 (two) times daily.   Yes [provider]  pimecrolimus (ELIDEL) 1 % cream Apply small amount with q-tip to each eyelid BID 03/07/17  Yes [provider]  vitamin B-12 (CYANOCOBALAMIN) 100 MCG tablet Take 100 mcg by mouth daily.   Yes [provider]  vitamin C (ASCORBIC ACID) 500 MG tablet Take 500 mg by mouth daily.    Yes [provider]  atorvastatin (  LIPITOR) 20 MG tablet Take 20 mg by mouth daily at 6 PM.  05/18/16 05/18/17  [provider]  colchicine 0.6 MG tablet One by mouth twice a day for the next 3 days then stop the medication and only use when you have a flare 05/17/16   [provider]    Allergies as of 10/18/2017  . (No Known Allergies)    Family History  Problem Relation Age of Onset  . Breast cancer Neg Hx     Social History   Socioeconomic History  .  Marital status: Married    Spouse name: Not on file  . Number of children: Not on file  . Years of education: Not on file  . Highest education level: Not on file  Occupational History  . Not on file  Social Needs  . Financial resource strain: Not on file  . Food insecurity:    Worry: Not on file    Inability: Not on file  . Transportation needs:    Medical: Not on file    Non-medical: Not on file  Tobacco Use  . Smoking status: Never Smoker  . Smokeless tobacco: Never Used  Substance and Sexual Activity  . Alcohol use: No  . Drug use: No  . Sexual activity: Not on file  Lifestyle  . Physical activity:    Days per week: Not on file    Minutes per session: Not on file  . Stress: Not on file  Relationships  . Social connections:    Talks on phone: Not on file    Gets together: Not on file    Attends religious service: Not on file    Active member of club or organization: Not on file    Attends meetings of clubs or organizations: Not on file    Relationship status: Not on file  . Intimate partner violence:    Fear of current or ex partner: Not on file    Emotionally abused: Not on file    Physically abused: Not on file    Forced sexual activity: Not on file  Other Topics Concern  . Not on file  Social History Narrative  . Not on file    Review of Systems: See HPI, otherwise negative ROS  Physical Exam: BP (!) 151/91   Pulse 79   Temp (!) 97.2 F (36.2 C) (Tympanic)   Resp 18   Ht 5\' 7"  (1.702 m)   Wt 104.3 kg   SpO2 94%   BMI 36.02 kg/m  General:   Alert,  pleasant and cooperative in NAD Head:  Normocephalic and atraumatic. Neck:  Supple; no masses or thyromegaly. Lungs:  Clear throughout to auscultation.    Heart:  Regular rate and rhythm. Abdomen:  Soft, nontender and nondistended. Normal bowel sounds, without guarding, and without rebound.   Neurologic:  Alert and  oriented x4;  grossly normal neurologically.  Impression/Plan: Lindsey Todd is  here for an endoscopy to be performed for follow up of duodenal polyp.  Risks, benefits, limitations, and alternatives regarding  endoscopy have been reviewed with the patient.  Questions have been answered.  All parties agreeable.   Gaylyn Cheers, MD  01/12/2018, 8:22 AM

## 2018-01-15 ENCOUNTER — Encounter: Payer: Self-pay | Admitting: Unknown Physician Specialty

## 2018-01-15 LAB — SURGICAL PATHOLOGY

## 2018-02-05 ENCOUNTER — Ambulatory Visit: Payer: BLUE CROSS/BLUE SHIELD

## 2018-02-15 ENCOUNTER — Ambulatory Visit
Admission: RE | Admit: 2018-02-15 | Discharge: 2018-02-15 | Disposition: A | Discharge status: Home / Self Care | Payer: BLUE CROSS/BLUE SHIELD | Source: Ambulatory Visit | Attending: Nurse Practitioner | Admitting: Nurse Practitioner

## 2018-02-15 DIAGNOSIS — K766 Portal hypertension: Secondary | ICD-10-CM | POA: Diagnosis present

## 2018-02-15 DIAGNOSIS — K8020 Calculus of gallbladder without cholecystitis without obstruction: Secondary | ICD-10-CM | POA: Diagnosis not present

## 2018-02-15 DIAGNOSIS — K760 Fatty (change of) liver, not elsewhere classified: Secondary | ICD-10-CM | POA: Diagnosis not present

## 2018-02-15 DIAGNOSIS — R945 Abnormal results of liver function studies: Secondary | ICD-10-CM | POA: Diagnosis not present

## 2018-02-15 DIAGNOSIS — K3189 Other diseases of stomach and duodenum: Secondary | ICD-10-CM | POA: Diagnosis not present

## 2018-05-14 ENCOUNTER — Other Ambulatory Visit: Payer: Self-pay | Admitting: Family Medicine

## 2018-05-14 DIAGNOSIS — Z780 Asymptomatic menopausal state: Secondary | ICD-10-CM

## 2018-10-01 ENCOUNTER — Other Ambulatory Visit: Payer: Self-pay | Admitting: Family Medicine

## 2018-10-01 DIAGNOSIS — Z1231 Encounter for screening mammogram for malignant neoplasm of breast: Secondary | ICD-10-CM

## 2018-11-07 ENCOUNTER — Ambulatory Visit
Admission: RE | Admit: 2018-11-07 | Discharge: 2018-11-07 | Disposition: A | Discharge status: Home / Self Care | Payer: Medicare HMO | Source: Ambulatory Visit | Attending: Family Medicine | Admitting: Family Medicine

## 2018-11-07 DIAGNOSIS — Z780 Asymptomatic menopausal state: Secondary | ICD-10-CM | POA: Diagnosis not present

## 2018-11-07 DIAGNOSIS — Z1231 Encounter for screening mammogram for malignant neoplasm of breast: Secondary | ICD-10-CM | POA: Insufficient documentation

## 2019-03-23 IMAGING — MG MM DIGITAL SCREENING BILAT W/ TOMO W/ CAD
6 of 10 series · 6 of 30 positions shown · non-contrast
Comparison: Previous exam(s).

CLINICAL DATA: Screening. History of excisional biopsy of the right
breast for a papilloma.

EXAM:
DIGITAL SCREENING BILATERAL MAMMOGRAM WITH TOMO AND CAD

[R CC synth-2D]
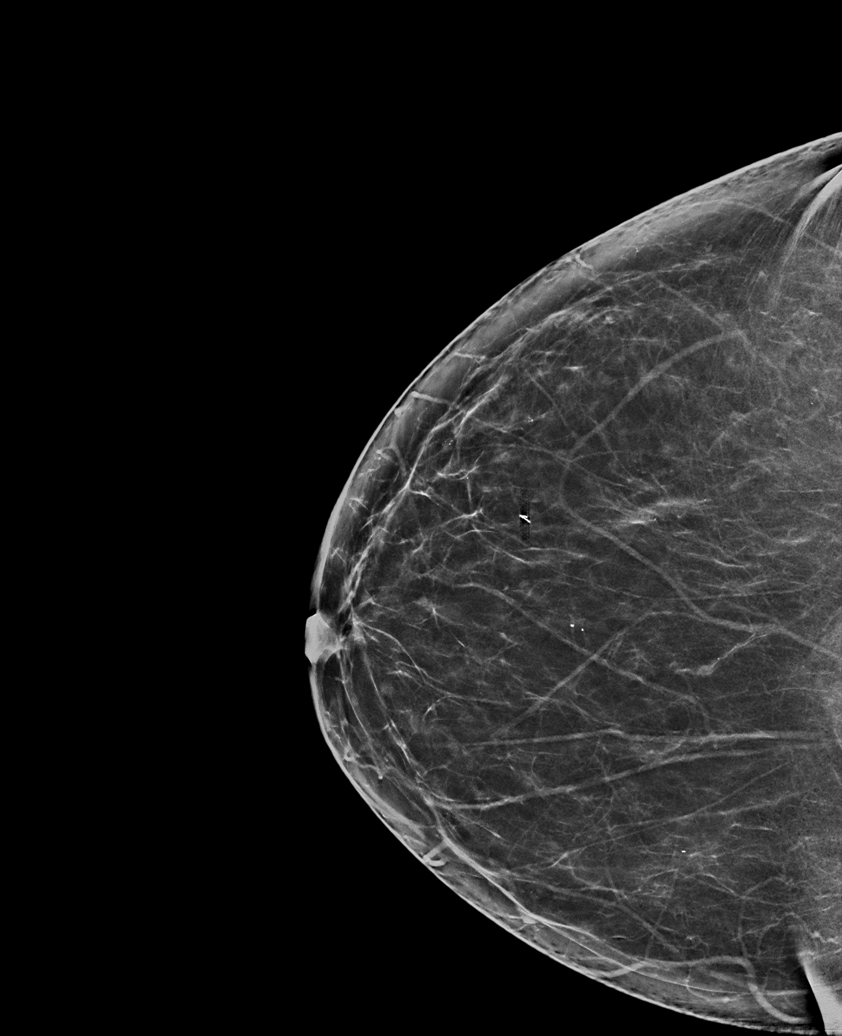

[L CC synth-2D]
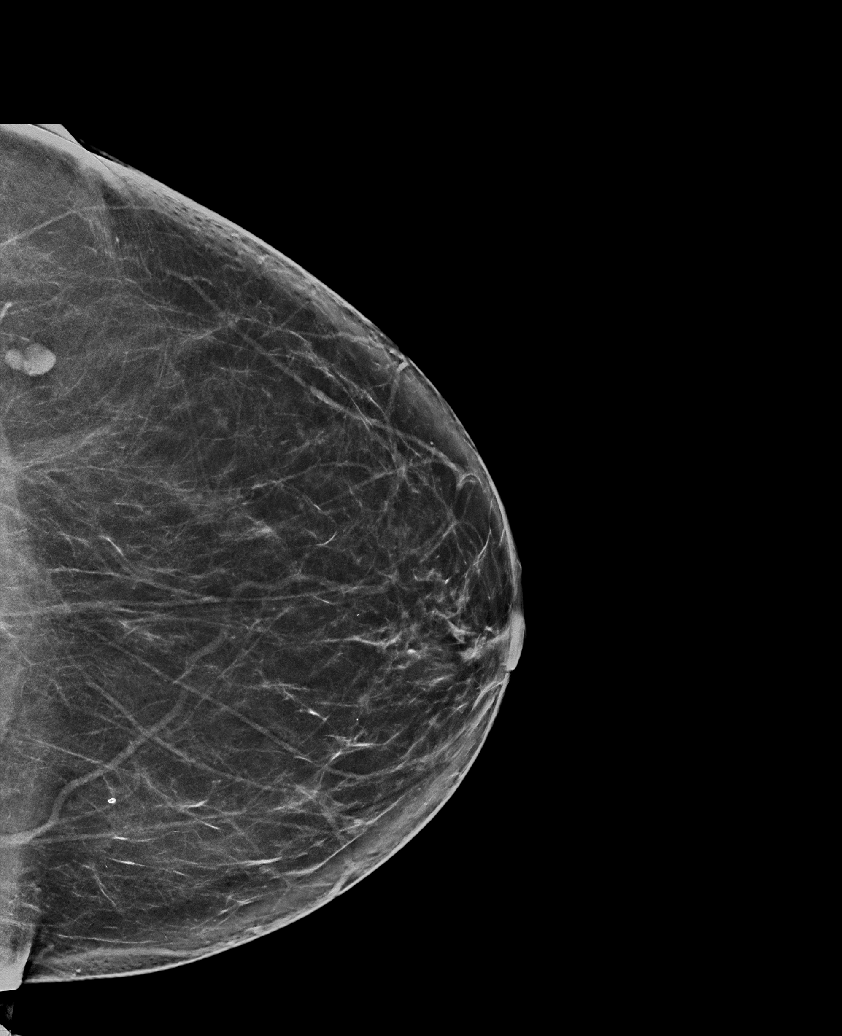

[L MLO synth-2D (1 of 2)]
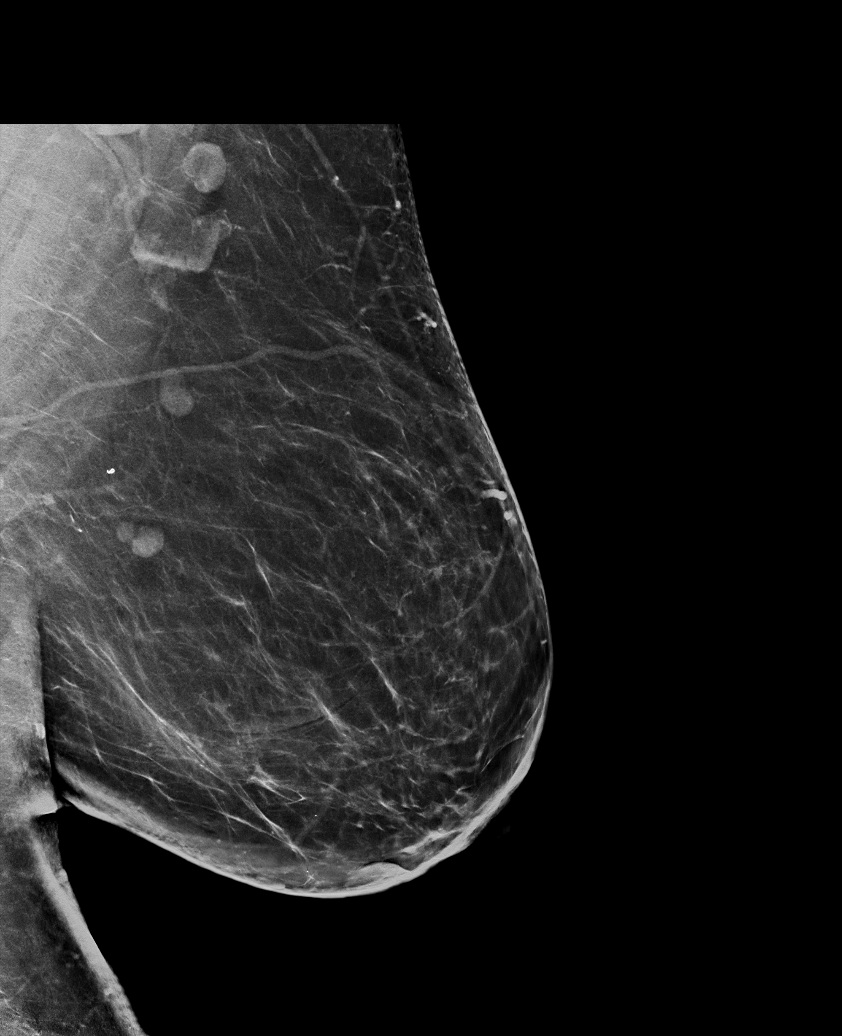

[L MLO synth-2D (2 of 2)]
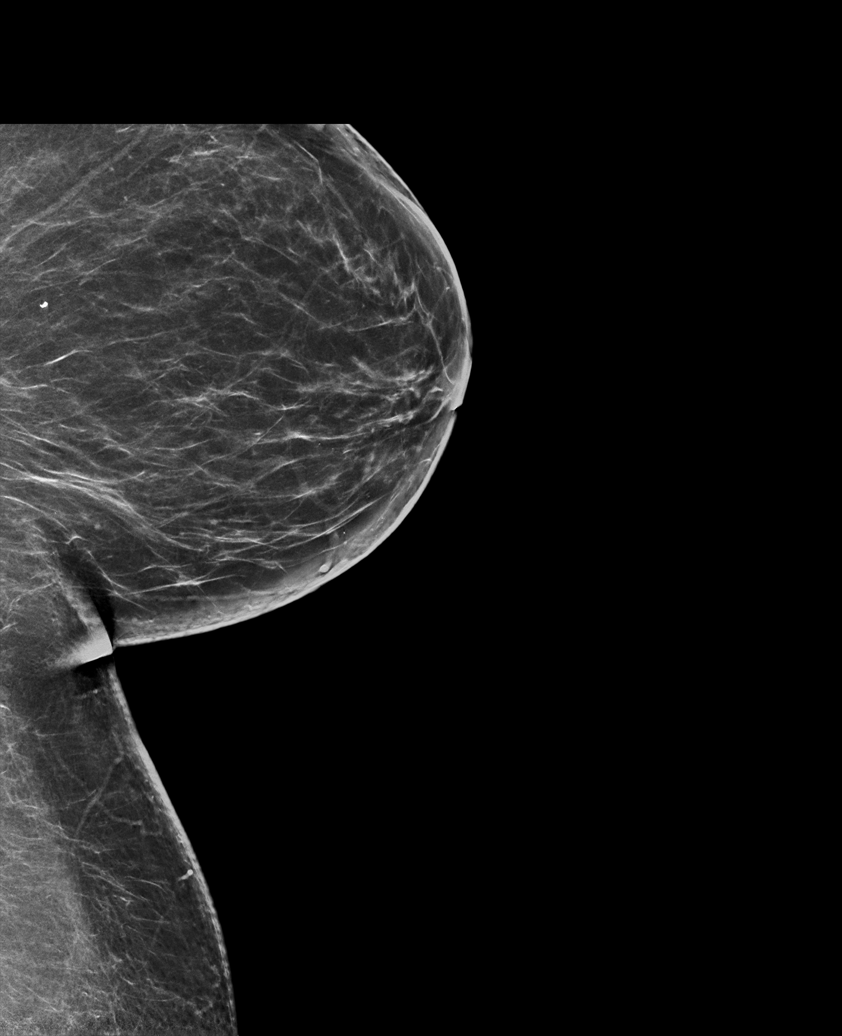

[R MLO synth-2D]
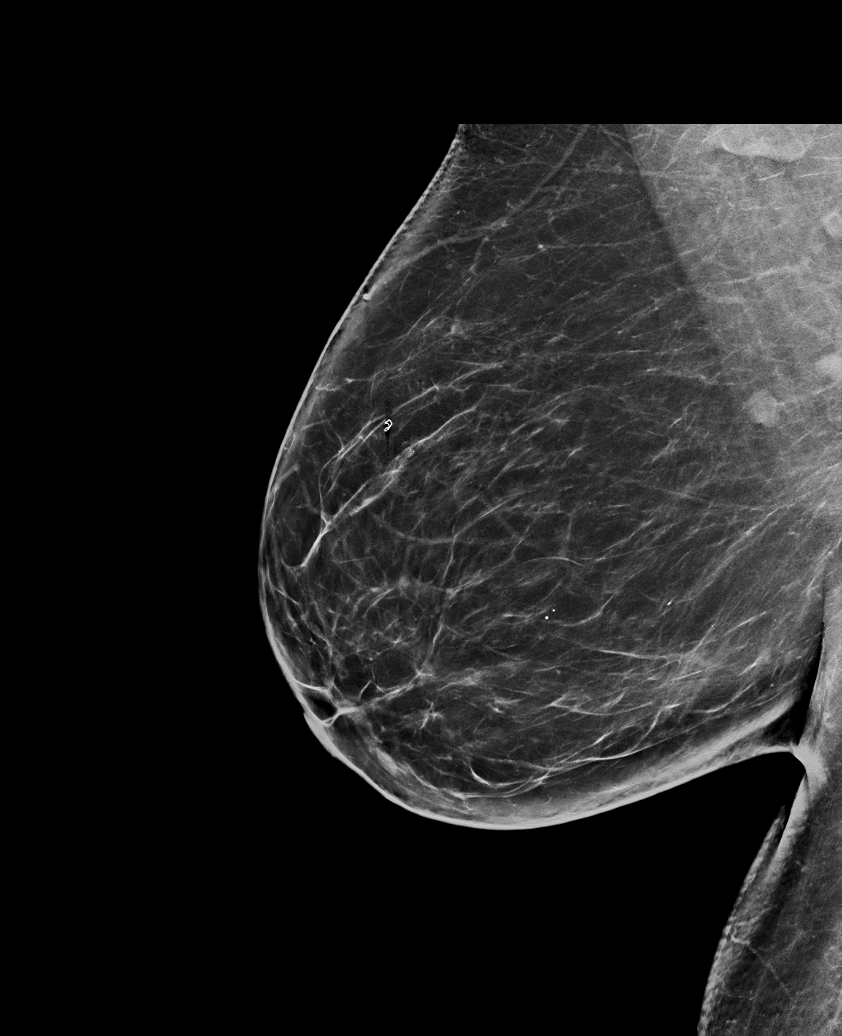

[L CC tomo · tomo slice 37/74.0]
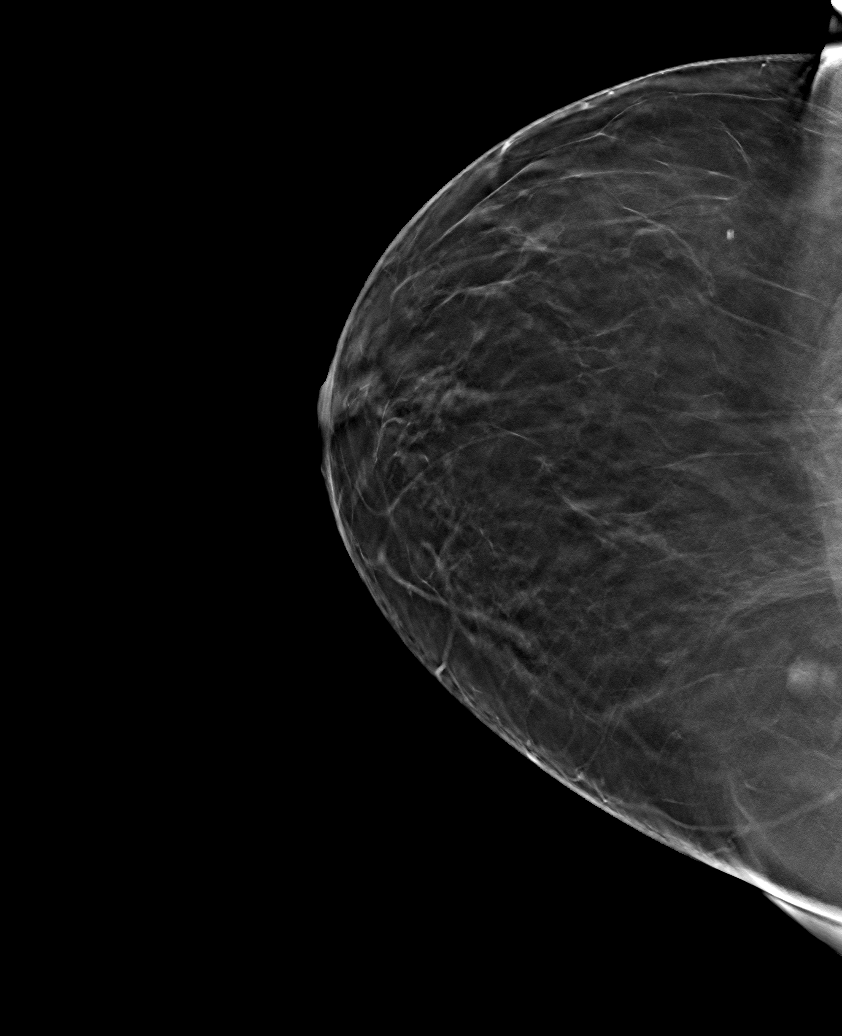

[6 of 30 positions shown; findings below may reference images not displayed]

ACR Breast Density Category b: There are scattered areas of
fibroglandular density.
FINDINGS: There are no findings suspicious for malignancy. Images were
processed with CAD.
IMPRESSION: No mammographic evidence of malignancy. A result letter of this
screening mammogram will be mailed directly to the patient.

RECOMMENDATION:
Screening mammogram in one year. (Code:JW-N-62R)

BI-RADS CATEGORY  1: Negative.

## 2019-10-14 ENCOUNTER — Other Ambulatory Visit: Payer: Self-pay | Admitting: Family Medicine

## 2019-10-14 DIAGNOSIS — Z1231 Encounter for screening mammogram for malignant neoplasm of breast: Secondary | ICD-10-CM

## 2019-11-08 ENCOUNTER — Ambulatory Visit
Admission: RE | Admit: 2019-11-08 | Discharge: 2019-11-08 | Disposition: A | Discharge status: Home / Self Care | Payer: Medicare HMO | Source: Ambulatory Visit | Attending: Family Medicine | Admitting: Family Medicine

## 2019-11-08 DIAGNOSIS — Z1231 Encounter for screening mammogram for malignant neoplasm of breast: Secondary | ICD-10-CM | POA: Diagnosis present

## 2020-10-28 ENCOUNTER — Other Ambulatory Visit: Payer: Self-pay | Admitting: Family Medicine

## 2020-10-28 DIAGNOSIS — Z1231 Encounter for screening mammogram for malignant neoplasm of breast: Secondary | ICD-10-CM

## 2020-11-10 ENCOUNTER — Other Ambulatory Visit: Payer: Self-pay

## 2020-11-10 ENCOUNTER — Ambulatory Visit
Admission: RE | Admit: 2020-11-10 | Discharge: 2020-11-10 | Disposition: A | Discharge status: Home / Self Care | Payer: Medicare HMO | Source: Ambulatory Visit | Attending: Family Medicine | Admitting: Family Medicine

## 2020-11-10 DIAGNOSIS — Z1231 Encounter for screening mammogram for malignant neoplasm of breast: Secondary | ICD-10-CM | POA: Diagnosis not present

## 2021-03-23 ENCOUNTER — Other Ambulatory Visit: Payer: Self-pay | Admitting: Nurse Practitioner

## 2021-03-23 DIAGNOSIS — Z8619 Personal history of other infectious and parasitic diseases: Secondary | ICD-10-CM

## 2021-03-23 DIAGNOSIS — K766 Portal hypertension: Secondary | ICD-10-CM

## 2021-03-23 DIAGNOSIS — K760 Fatty (change of) liver, not elsewhere classified: Secondary | ICD-10-CM

## 2021-03-30 ENCOUNTER — Other Ambulatory Visit: Payer: Self-pay

## 2021-03-30 ENCOUNTER — Ambulatory Visit
Admission: RE | Admit: 2021-03-30 | Discharge: 2021-03-30 | Disposition: A | Discharge status: Home / Self Care | Payer: Medicare HMO | Source: Ambulatory Visit | Attending: Nurse Practitioner | Admitting: Nurse Practitioner

## 2021-03-30 DIAGNOSIS — Z8619 Personal history of other infectious and parasitic diseases: Secondary | ICD-10-CM | POA: Insufficient documentation

## 2021-03-30 DIAGNOSIS — K760 Fatty (change of) liver, not elsewhere classified: Secondary | ICD-10-CM | POA: Diagnosis not present

## 2021-03-30 DIAGNOSIS — K3189 Other diseases of stomach and duodenum: Secondary | ICD-10-CM | POA: Insufficient documentation

## 2021-03-30 DIAGNOSIS — K766 Portal hypertension: Secondary | ICD-10-CM | POA: Diagnosis present

## 2021-06-11 ENCOUNTER — Encounter: Payer: Self-pay | Admitting: Gastroenterology

## 2021-06-13 ENCOUNTER — Encounter: Payer: Self-pay | Admitting: Gastroenterology

## 2021-06-13 NOTE — H&P (Signed)
? ?Pre-Procedure H&P ?  ?Patient ID: Lindsey Todd is a 71 y.o. female. ? ?Gastroenterology Provider: [name], DO ? ?Referring Provider: Dawson Bills, NP ?PCP: Gayland Curry, MD ? ?Date: 06/14/2021 ? ?HPI ?[name] is a 71 y.o. female who presents today for Esophagogastroduodenoscopy and Colonoscopy for Evaluation for portal hypertensive gastropathy and surveillance-personal history of colon polyps. ?Patient with history of nonalcoholic fatty liver disease who was found to have portal hypertensive gastropathy per previous EGD report in 2019.  She also has a history of non-adenomatous duodenal polyps seen in 2019 and 2018.  She has a history of H. pylori status posttreatment with follow-up stool antigen negative after treatment. ? ?Longstanding history of constipation.  Has a bowel movement approximately every 3 days no melena or hematochezia.  Last colonoscopy in July 2018 demonstrating diverticulosis and 1 adenomatous polyp. ? ?She is status post hysterectomy ? ?No family history of colorectal cancer or colon polyps ? ?Past Medical History:  ?Diagnosis Date  ? Anemia   ? Eczema   ? Elevated LDL cholesterol level   ? Gout   ? Gout   ? Hyperlipidemia   ? Hypertension   ? Hypothyroidism   ? Iron deficiency   ? Iron deficiency   ? Thyroid disease   ? Vitamin B 12 deficiency   ? ? ?Past Surgical History:  ?Procedure Laterality Date  ? ABDOMINAL HYSTERECTOMY  07/2007  ? with oopherectomy  ? BREAST BIOPSY Right 2006  ? benign - stereotactic biopsy  ? BREAST BIOPSY Right 06/17/2015  ? right breast biopsy papilloma  ? BREAST LUMPECTOMY Right 12/19/2016  ? papilloma excised  ? BREAST LUMPECTOMY Right 12/19/2016  ? Procedure: EXCISION RIGHT BREAST RETROAREOLAR PAPILLOMA;  Surgeon: Robert Bellow, MD;  Location: [facility] ORS;  Service: General;  Laterality: Right;  ? COLONOSCOPY    ? COLONOSCOPY WITH PROPOFOL N/A 09/16/2016  ? Procedure: COLONOSCOPY WITH PROPOFOL;  Surgeon: Manya Silvas, MD;   Location: [facility] ENDOSCOPY;  Service: Endoscopy;  Laterality: N/A;  ? ESOPHAGOGASTRODUODENOSCOPY N/A 01/12/2018  ? Procedure: ESOPHAGOGASTRODUODENOSCOPY (EGD);  Surgeon: Manya Silvas, MD;  Location: [facility] ENDOSCOPY;  Service: Endoscopy;  Laterality: N/A;  ? ESOPHAGOGASTRODUODENOSCOPY (EGD) WITH PROPOFOL N/A 09/16/2016  ? Procedure: ESOPHAGOGASTRODUODENOSCOPY (EGD) WITH PROPOFOL;  Surgeon: Manya Silvas, MD;  Location: [facility] ENDOSCOPY;  Service: Endoscopy;  Laterality: N/A;  ? ESOPHAGOGASTRODUODENOSCOPY (EGD) WITH PROPOFOL N/A 10/11/2017  ? Procedure: ESOPHAGOGASTRODUODENOSCOPY (EGD) WITH PROPOFOL;  Surgeon: Manya Silvas, MD;  Location: [facility] ENDOSCOPY;  Service: Endoscopy;  Laterality: N/A;  BMI=40  ? EYE SURGERY Bilateral 10/2015  ? TOTAL ABDOMINAL HYSTERECTOMY W/ BILATERAL SALPINGOOPHORECTOMY    ? ? ?Family History ?No h/o GI disease or malignancy ? ?Review of Systems  ?Constitutional:  Negative for activity change, appetite change, chills, diaphoresis, fatigue, fever and unexpected weight change.  ?HENT:  Negative for trouble swallowing and voice change.   ?Respiratory:  Negative for shortness of breath and wheezing.   ?Cardiovascular:  Negative for chest pain, palpitations and leg swelling.  ?Gastrointestinal:  Positive for constipation. Negative for abdominal distention, abdominal pain, anal bleeding, blood in stool, diarrhea, nausea, rectal pain and vomiting.  ?Musculoskeletal:  Negative for arthralgias and myalgias.  ?Skin:  Negative for color change and pallor.  ?Neurological:  Negative for dizziness, syncope and weakness.  ?Psychiatric/Behavioral:  Negative for confusion.   ?All other systems reviewed and are negative.  ? ?Medications ?No current facility-administered medications on file prior to encounter.  ? ?Current Outpatient Medications  on File Prior to Encounter  ?Medication Sig Dispense Refill  ? allopurinol (ZYLOPRIM) 100 MG tablet daily.     ? aspirin EC 81 MG tablet Take 81 mg by mouth  daily.    ? atorvastatin (LIPITOR) 20 MG tablet   2  ? ferrous sulfate 325 (65 FE) MG tablet Take by mouth.    ? Glucosamine-Chondroitin (GLUCOSAMINE CHONDR COMPLEX PO) Take by mouth.    ? levocetirizine (XYZAL) 5 MG tablet   10  ? levothyroxine (SYNTHROID, LEVOTHROID) 25 MCG tablet Take 25 mcg by mouth daily before breakfast.   0  ? losartan (COZAAR) 25 MG tablet Take 25 mg by mouth every morning.     ? Misc Natural Products (TART CHERRY ADVANCED PO) Take 1 capsule by mouth 2 (two) times daily.    ? vitamin B-12 (CYANOCOBALAMIN) 100 MCG tablet Take 100 mcg by mouth daily.    ? vitamin C (ASCORBIC ACID) 500 MG tablet Take 500 mg by mouth daily.     ? atorvastatin (LIPITOR) 20 MG tablet Take 20 mg by mouth daily at 6 PM.     ? colchicine 0.6 MG tablet One by mouth twice a day for the next 3 days then stop the medication and only use when you have a flare    ? fluticasone (FLONASE) 50 MCG/ACT nasal spray Place into both nostrils daily.    ? Misc Natural Products (OSTEO BI-FLEX ADV TRIPLE ST) TABS Take 1 tablet by mouth 2 (two) times daily.    ? pimecrolimus (ELIDEL) 1 % cream Apply small amount with q-tip to each eyelid BID    ? ? ?Pertinent medications related to GI and procedure were reviewed by me with the patient prior to the procedure ? ? ?Current Facility-Administered Medications:  ?  0.9 %  sodium chloride infusion, , Intravenous, Continuous, [name], DO, Last Rate: 20 mL/hr at 06/14/21 0830, New Bag at 06/14/21 0830 ?  ?  ? ?No Known Allergies ?Allergies were reviewed by me prior to the procedure ? ?Objective  ? ?Body mass index is 33.67 kg/m?. ?Vitals:  ? 06/14/21 0803  ?BP: (!) 128/94  ?Pulse: (!) 108  ?Resp: 18  ?Temp: (!) 97.3 ?F (36.3 ?C)  ?TempSrc: Temporal  ?SpO2: 96%  ?Weight: 97.5 kg  ?Height: '5\' 7"'$  (1.702 m)  ? ? ? ?Physical Exam ?Vitals and nursing note reviewed.  ?Constitutional:   ?   General: She is not in acute distress. ?   Appearance: Normal appearance. She is obese. She is not  ill-appearing, toxic-appearing or diaphoretic.  ?HENT:  ?   Head: Normocephalic and atraumatic.  ?   Nose: Nose normal.  ?   Mouth/Throat:  ?   Mouth: Mucous membranes are moist.  ?   Pharynx: Oropharynx is clear.  ?Eyes:  ?   General: No scleral icterus. ?   Extraocular Movements: Extraocular movements intact.  ?Cardiovascular:  ?   Rate and Rhythm: Regular rhythm. Tachycardia present.  ?   Heart sounds: Normal heart sounds. No murmur heard. ?  No friction rub. No gallop.  ?Pulmonary:  ?   Effort: Pulmonary effort is normal. No respiratory distress.  ?   Breath sounds: Normal breath sounds. No wheezing, rhonchi or rales.  ?Abdominal:  ?   General: Bowel sounds are normal. There is no distension.  ?   Palpations: Abdomen is soft.  ?   Tenderness: There is no abdominal tenderness. There is no guarding or rebound.  ?Musculoskeletal:  ?  Cervical back: Neck supple.  ?   Right lower leg: No edema.  ?   Left lower leg: No edema.  ?Skin: ?   General: Skin is warm and dry.  ?   Coloration: Skin is not jaundiced or pale.  ?Neurological:  ?   General: No focal deficit present.  ?   Mental Status: She is alert and oriented to person, place, and time. Mental status is at baseline.  ?Psychiatric:     ?   Mood and Affect: Mood normal.     ?   Behavior: Behavior normal.     ?   Thought Content: Thought content normal.     ?   Judgment: Judgment normal.  ? ? ? ?Assessment:  ?[name] is a 71 y.o. female  who presents today for Esophagogastroduodenoscopy and Colonoscopy for Evaluation for portal hypertensive gastropathy and surveillance-personal history of colon polyps. ? ?Plan:  ?Esophagogastroduodenoscopy and Colonoscopy with possible intervention today ? ?Esophagogastroduodenoscopy and Colonoscopy with possible biopsy, control of bleeding, polypectomy, and interventions as necessary has been discussed with the patient/patient representative. Informed consent was obtained from the patient/patient representative after  explaining the indication, nature, and risks of the procedure including but not limited to death, bleeding, perforation, missed neoplasm/lesions, cardiorespiratory compromise, and reaction to medications.

## 2021-06-14 ENCOUNTER — Ambulatory Visit: Payer: Medicare HMO | Admitting: Anesthesiology

## 2021-06-14 ENCOUNTER — Encounter
Admission: RE | Disposition: A | Discharge status: Home / Self Care | Payer: Self-pay | Source: Home / Self Care | Attending: Gastroenterology

## 2021-06-14 ENCOUNTER — Ambulatory Visit
Admission: RE | Admit: 2021-06-14 | Discharge: 2021-06-14 | Disposition: A | Discharge status: Home / Self Care | Payer: Medicare HMO | Attending: Gastroenterology | Admitting: Gastroenterology

## 2021-06-14 ENCOUNTER — Encounter: Payer: Self-pay | Admitting: Gastroenterology

## 2021-06-14 DIAGNOSIS — Z79899 Other long term (current) drug therapy: Secondary | ICD-10-CM | POA: Diagnosis not present

## 2021-06-14 DIAGNOSIS — K2950 Unspecified chronic gastritis without bleeding: Secondary | ICD-10-CM | POA: Insufficient documentation

## 2021-06-14 DIAGNOSIS — M109 Gout, unspecified: Secondary | ICD-10-CM | POA: Diagnosis not present

## 2021-06-14 DIAGNOSIS — Z1211 Encounter for screening for malignant neoplasm of colon: Secondary | ICD-10-CM | POA: Diagnosis not present

## 2021-06-14 DIAGNOSIS — Z79890 Hormone replacement therapy (postmenopausal): Secondary | ICD-10-CM | POA: Diagnosis not present

## 2021-06-14 DIAGNOSIS — I10 Essential (primary) hypertension: Secondary | ICD-10-CM | POA: Diagnosis not present

## 2021-06-14 DIAGNOSIS — E785 Hyperlipidemia, unspecified: Secondary | ICD-10-CM | POA: Insufficient documentation

## 2021-06-14 DIAGNOSIS — K5730 Diverticulosis of large intestine without perforation or abscess without bleeding: Secondary | ICD-10-CM | POA: Diagnosis not present

## 2021-06-14 DIAGNOSIS — D120 Benign neoplasm of cecum: Secondary | ICD-10-CM | POA: Diagnosis not present

## 2021-06-14 DIAGNOSIS — D123 Benign neoplasm of transverse colon: Secondary | ICD-10-CM | POA: Insufficient documentation

## 2021-06-14 DIAGNOSIS — Z09 Encounter for follow-up examination after completed treatment for conditions other than malignant neoplasm: Secondary | ICD-10-CM | POA: Insufficient documentation

## 2021-06-14 DIAGNOSIS — D122 Benign neoplasm of ascending colon: Secondary | ICD-10-CM | POA: Insufficient documentation

## 2021-06-14 DIAGNOSIS — K317 Polyp of stomach and duodenum: Secondary | ICD-10-CM | POA: Diagnosis not present

## 2021-06-14 DIAGNOSIS — E039 Hypothyroidism, unspecified: Secondary | ICD-10-CM | POA: Insufficient documentation

## 2021-06-14 DIAGNOSIS — D124 Benign neoplasm of descending colon: Secondary | ICD-10-CM | POA: Insufficient documentation

## 2021-06-14 DIAGNOSIS — K640 First degree hemorrhoids: Secondary | ICD-10-CM | POA: Insufficient documentation

## 2021-06-14 DIAGNOSIS — Z86010 Personal history of colonic polyps: Secondary | ICD-10-CM | POA: Insufficient documentation

## 2021-06-14 HISTORY — PX: ESOPHAGOGASTRODUODENOSCOPY (EGD) WITH PROPOFOL: SHX5813

## 2021-06-14 HISTORY — PX: COLONOSCOPY WITH PROPOFOL: SHX5780

## 2021-06-14 SURGERY — COLONOSCOPY WITH PROPOFOL
Anesthesia: General

## 2021-06-14 MED ORDER — PROPOFOL 10 MG/ML IV BOLUS
INTRAVENOUS | Status: DC | PRN
  Administered 2021-06-14: 20 mg via INTRAVENOUS
  Administered 2021-06-14: 30 mg via INTRAVENOUS
  Administered 2021-06-14: 10 mg via INTRAVENOUS
  Administered 2021-06-14: 70 mg via INTRAVENOUS

## 2021-06-14 MED ORDER — LIDOCAINE HCL (CARDIAC) PF 100 MG/5ML IV SOSY
PREFILLED_SYRINGE | INTRAVENOUS | Status: DC | PRN
  Administered 2021-06-14: 80 mg via INTRAVENOUS

## 2021-06-14 MED ORDER — SODIUM CHLORIDE 0.9 % IV SOLN: INTRAVENOUS | Status: DC

## 2021-06-14 MED ORDER — PROPOFOL 500 MG/50ML IV EMUL
INTRAVENOUS | Status: DC | PRN
  Administered 2021-06-14: 140 ug/kg/min via INTRAVENOUS

## 2021-06-14 MED ORDER — PHENYLEPHRINE HCL (PRESSORS) 10 MG/ML IV SOLN
INTRAVENOUS | Status: DC | PRN
  Administered 2021-06-14 (×3): 80 ug via INTRAVENOUS

## 2021-06-14 MED ORDER — DEXMEDETOMIDINE (PRECEDEX) IN NS 20 MCG/5ML (4 MCG/ML) IV SYRINGE
PREFILLED_SYRINGE | INTRAVENOUS | Status: DC | PRN
  Administered 2021-06-14: 8 ug via INTRAVENOUS
  Administered 2021-06-14: 4 ug via INTRAVENOUS

## 2021-06-14 NOTE — Anesthesia Preprocedure Evaluation (Signed)
Anesthesia Evaluation  ?Patient identified by MRN, date of birth, ID band ?Patient awake ? ? ? ?Reviewed: ?Allergy & Precautions, NPO status , Patient's Chart, lab work & pertinent test results ? ?History of Anesthesia Complications ?Negative for: history of anesthetic complications ? ?Airway ?Mallampati: III ? ?TM Distance: <3 FB ?Neck ROM: full ? ? ? Dental ? ?(+) Chipped ?  ?Pulmonary ?neg pulmonary ROS, neg shortness of breath,  ?  ?Pulmonary exam normal ? ? ? ? ? ? ? Cardiovascular ?hypertension, (-) anginaNormal cardiovascular exam ? ? ?  ?Neuro/Psych ?negative neurological ROS ? negative psych ROS  ? GI/Hepatic ?negative GI ROS, Neg liver ROS, neg GERD  ,  ?Endo/Other  ?Hypothyroidism  ? Renal/GU ?negative Renal ROS  ?negative genitourinary ?  ?Musculoskeletal ? ? Abdominal ?  ?Peds ? Hematology ?negative hematology ROS ?(+)   ?Anesthesia Other Findings ?Past Medical History: ?No date: Anemia ?No date: Eczema ?No date: Elevated LDL cholesterol level ?No date: Gout ?No date: Gout ?No date: Hyperlipidemia ?No date: Hypertension ?No date: Hypothyroidism ?No date: Iron deficiency ?No date: Iron deficiency ?No date: Thyroid disease ?No date: Vitamin B 12 deficiency ? ?Past Surgical History: ?07/2007: ABDOMINAL HYSTERECTOMY ?    Comment:  with oopherectomy ?2006: BREAST BIOPSY; Right ?    Comment:  benign - stereotactic biopsy ?06/17/2015: BREAST BIOPSY; Right ?    Comment:  right breast biopsy papilloma ?12/19/2016: BREAST LUMPECTOMY; Right ?    Comment:  papilloma excised ?12/19/2016: BREAST LUMPECTOMY; Right ?    Comment:  Procedure: EXCISION RIGHT BREAST RETROAREOLAR PAPILLOMA; ?             Surgeon: Robert Bellow, MD;  Location: [facility] ORS;   ?             Service: General;  Laterality: Right; ?No date: COLONOSCOPY ?09/16/2016: COLONOSCOPY WITH PROPOFOL; N/A ?    Comment:  Procedure: COLONOSCOPY WITH PROPOFOL;  Surgeon: Vira Agar, ?             Gavin Pound, MD;  Location: [facility]  ENDOSCOPY;  Service:  ?             Endoscopy;  Laterality: N/A; ?01/12/2018: ESOPHAGOGASTRODUODENOSCOPY; N/A ?    Comment:  Procedure: ESOPHAGOGASTRODUODENOSCOPY (EGD);  Surgeon:  ?             Manya Silvas, MD;  Location: [facility] ENDOSCOPY;   ?             Service: Endoscopy;  Laterality: N/A; ?09/16/2016: ESOPHAGOGASTRODUODENOSCOPY (EGD) WITH PROPOFOL; N/A ?    Comment:  Procedure: ESOPHAGOGASTRODUODENOSCOPY (EGD) WITH  ?             PROPOFOL;  Surgeon: Manya Silvas, MD;  Location:  ?             [place] ENDOSCOPY;  Service: Endoscopy;  Laterality: N/A; ?10/11/2017: ESOPHAGOGASTRODUODENOSCOPY (EGD) WITH PROPOFOL; N/A ?    Comment:  Procedure: ESOPHAGOGASTRODUODENOSCOPY (EGD) WITH  ?             PROPOFOL;  Surgeon: Manya Silvas, MD;  Location:  ?             [place] ENDOSCOPY;  Service: Endoscopy;  Laterality: N/A;   ?             BMI=40 ?10/2015: EYE SURGERY; Bilateral ?No date: TOTAL ABDOMINAL HYSTERECTOMY W/ BILATERAL SALPINGOOPHORECTOMY ? ?BMI   ? Body Mass Index: 33.67 kg/m?  ?  ? ? Reproductive/Obstetrics ?negative OB ROS ? ?  ? ? ? ? ? ? ? ? ? ? ? ? ? ?  ?  ? ? ? ? ? ? ? ? ?  Anesthesia Physical ?Anesthesia Plan ? ?ASA: 3 ? ?Anesthesia Plan: General  ? ?Post-op Pain Management:   ? ?Induction: Intravenous ? ?PONV Risk Score and Plan: Propofol infusion and TIVA ? ?Airway Management Planned: Natural Airway and Nasal Cannula ? ?Additional Equipment:  ? ?Intra-op Plan:  ? ?Post-operative Plan:  ? ?Informed Consent: I have reviewed the patients History and Physical, chart, labs and discussed the procedure including the risks, benefits and alternatives for the proposed anesthesia with the patient or authorized representative who has indicated his/her understanding and acceptance.  ? ? ? ?Dental Advisory Given ? ?Plan Discussed with: Anesthesiologist, CRNA and Surgeon ? ?Anesthesia Plan Comments: (Patient consented for risks of anesthesia including but not limited to:  ?- adverse reactions to medications ?- risk  of airway placement if required ?- damage to eyes, teeth, lips or other oral mucosa ?- nerve damage due to positioning  ?- sore throat or hoarseness ?- Damage to heart, brain, nerves, lungs, other parts of body or loss of life ? ?Patient voiced understanding.)  ? ? ? ? ? ? ?Anesthesia Quick Evaluation ? ?

## 2021-06-14 NOTE — Interval H&P Note (Signed)
History and Physical Interval Note: Preprocedure H&P from 06/14/21 ? was reviewed and there was no interval change after seeing and examining the patient.  Written consent was obtained from the patient after discussion of risks, benefits, and alternatives. Patient has consented to proceed with Esophagogastroduodenoscopy and Colonoscopy with possible intervention ? ? ?06/14/2021 ?8:36 AM ? ?Lindsey Todd  has presented today for surgery, with the diagnosis of Polyp of stomach and Duodenum.  The various methods of treatment have been discussed with the patient and family. After consideration of risks, benefits and other options for treatment, the patient has consented to  Procedure(s): ?COLONOSCOPY WITH PROPOFOL (N/A) ?ESOPHAGOGASTRODUODENOSCOPY (EGD) WITH PROPOFOL (N/A) as a surgical intervention.  The patient's history has been reviewed, patient examined, no change in status, stable for surgery.  I have reviewed the patient's chart and labs.  Questions were answered to the patient's satisfaction.   ? ? ?[name] ? ? ?

## 2021-06-14 NOTE — Op Note (Signed)
[facility] ?Gastroenterology ?Patient Name: Lindsey Todd ?Procedure Date: 06/14/2021 8:41 AM ?MRN: [id number] ?Account #: [id number] ?Date of Birth: 1950/08/15 ?Admit Type: Outpatient ?Age: 71 ?Room: [facility] ENDO ROOM 2 ?Gender: Female ?Note Status: Finalized ?Instrument Name: Colonoscope [id number] ?Procedure:             Colonoscopy ?Indications:           High risk colon cancer surveillance: Personal history  ?                       of colonic polyps ?Providers:             [name] DO, DO ?Referring MD:          Gayland Curry MD, MD (Referring MD) ?Medicines:             Monitored Anesthesia Care ?Complications:         No immediate complications. Estimated blood loss:  ?                       Minimal. ?Procedure:             Pre-Anesthesia Assessment: ?                       - Prior to the procedure, a History and Physical was  ?                       performed, and patient medications and allergies were  ?                       reviewed. The patient is competent. The risks and  ?                       benefits of the procedure and the sedation options and  ?                       risks were discussed with the patient. All questions  ?                       were answered and informed consent was obtained.  ?                       Patient identification and proposed procedure were  ?                       verified by the physician, the nurse, the anesthetist  ?                       and the technician in the endoscopy suite. Mental  ?                       Status Examination: alert and oriented. Airway  ?                       Examination: normal oropharyngeal airway and neck  ?                       mobility. Respiratory Examination: clear to  ?  auscultation. CV Examination: RRR, no murmurs, no S3  ?                       or S4. Prophylactic Antibiotics: The patient does not  ?                       require prophylactic antibiotics. Prior  ?                        Anticoagulants: The patient has taken no previous  ?                       anticoagulant or antiplatelet agents. ASA Grade  ?                       Assessment: III - A patient with severe systemic  ?                       disease. After reviewing the risks and benefits, the  ?                       patient was deemed in satisfactory condition to  ?                       undergo the procedure. The anesthesia plan was to use  ?                       monitored anesthesia care (MAC). Immediately prior to  ?                       administration of medications, the patient was  ?                       re-assessed for adequacy to receive sedatives. The  ?                       heart rate, respiratory rate, oxygen saturations,  ?                       blood pressure, adequacy of pulmonary ventilation, and  ?                       response to care were monitored throughout the  ?                       procedure. The physical status of the patient was  ?                       re-assessed after the procedure. ?                       After obtaining informed consent, the colonoscope was  ?                       passed under direct vision. Throughout the procedure,  ?                       the patient's blood pressure, pulse, and oxygen  ?  saturations were monitored continuously. The  ?                       Colonoscope was introduced through the anus and  ?                       advanced to the the cecum, identified by appendiceal  ?                       orifice and ileocecal valve. The colonoscopy was  ?                       performed without difficulty. The patient tolerated  ?                       the procedure well. The quality of the bowel  ?                       preparation was evaluated using the BBPS [facility] Bowel  ?                       Preparation Scale) with scores of: Right Colon = 2  ?                       (minor amount of residual staining, small fragments of  ?                        stool and/or opaque liquid, but mucosa seen well),  ?                       Transverse Colon = 3 (entire mucosa seen well with no  ?                       residual staining, small fragments of stool or opaque  ?                       liquid) and Left Colon = 2 (minor amount of residual  ?                       staining, small fragments of stool and/or opaque  ?                       liquid, but mucosa seen well). The total BBPS score  ?                       equals 7. The quality of the bowel preparation was  ?                       good. The ileocecal valve, appendiceal orifice, and  ?                       rectum were photographed. ?Findings: ?     The perianal and digital rectal examinations were normal. Pertinent  ?     negatives include normal sphincter tone. ?     Non-bleeding internal hemorrhoids were found during retroflexion. The  ?     hemorrhoids were Grade I (internal hemorrhoids that do not  prolapse).  ?     Estimated blood loss: none. ?     Multiple small-mouthed diverticula were found in the left colon.  ?     Estimated blood loss: none. ?     Four sessile polyps were found in the descending colon (2) and cecum  ?     (2). The polyps were 2 to 4 mm in size. These polyps were removed with a  ?     cold snare. Resection and retrieval were complete. Estimated blood loss  ?     was minimal. ?     Two sessile polyps were found in the transverse colon. The polyps were 1  ?     to 2 mm in size. These polyps were removed with a jumbo cold forceps.  ?     Resection and retrieval were complete. Estimated blood loss was minimal. ?     A 10 to 11 mm polyp was found in the ascending colon near hepatic  ?     flexure. The polyp was sessile. The polyp was removed with a cold snare.  ?     Resection and retrieval were complete. To prevent bleeding after the  ?     polypectomy, one hemostatic clip was successfully placed (MR  ?     conditional). There was no bleeding at the end of the procedure.  ?     Estimated blood  loss was minimal. ?     The exam was otherwise without abnormality on direct and retroflexion  ?     views. ?Impression:            - Non-bleeding internal hemorrhoids. ?                       - Diverticulosis in the left colon. ?                       - Four 2 to 4 mm polyps in the descending colon and in  ?                       the cecum, removed with a cold snare. Resected and  ?                       retrieved. ?                       - Two 1 to 2 mm polyps in the transverse colon,  ?                       removed with a jumbo cold forceps. Resected and  ?                       retrieved. ?                       - One 10 to 11 mm polyp in the ascending colon,  ?                       removed with a cold snare. Resected and retrieved.  ?                       Clip (MR conditional) was placed. ?                       -  The examination was otherwise normal on direct and  ?                       retroflexion views. ?Recommendation:        - Discharge patient to home. ?                       - Resume previous diet. ?                       - No aspirin, ibuprofen, naproxen, or other  ?                       non-steroidal anti-inflammatory drugs for 5 days after  ?                       polyp removal. ?                       - Continue present medications. ?                       - Await pathology results. ?                       - Repeat colonoscopy in 1 year for surveillance. ?                       - Return to referring physician as previously  ?                       scheduled. ?                       - The findings and recommendations were discussed with  ?                       the patient. ?Procedure Code(s):     --- Professional --- ?                       [phone], Colonoscopy, flexible; with removal of  ?                       tumor(s), polyp(s), or other lesion(s) by snare  ?                       technique ?                       [id number], 59, Colonoscopy, flexible; with biopsy, single  ?                        or multiple ?Diagnosis Code(s):     --- Professional --- ?                       K63.5, Polyp of colon ?                       Z86.010, Personal history of colonic polyps ?                       K64.

## 2021-06-14 NOTE — Anesthesia Postprocedure Evaluation (Signed)
Anesthesia Post Note ? ?Patient: BARBRA MINER ? ?Procedure(s) Performed: COLONOSCOPY WITH PROPOFOL ?ESOPHAGOGASTRODUODENOSCOPY (EGD) WITH PROPOFOL ? ?Patient location during evaluation: Endoscopy ?Anesthesia Type: General ?Level of consciousness: awake and alert ?Pain management: pain level controlled ?Vital Signs Assessment: post-procedure vital signs reviewed and stable ?Respiratory status: spontaneous breathing, nonlabored ventilation, respiratory function stable and patient connected to nasal cannula oxygen ?Cardiovascular status: blood pressure returned to baseline and stable ?Postop Assessment: no apparent nausea or vomiting ?Anesthetic complications: no ? ? ?No notable events documented. ? ? ?Last Vitals:  ?Vitals:  ? 06/14/21 0950 06/14/21 1000  ?BP: 98/66 107/64  ?Pulse: 88 100  ?Resp: (!) 28 (!) 22  ?Temp:    ?SpO2: 94% 96%  ?  ?Last Pain:  ?Vitals:  ? 06/14/21 0945  ?TempSrc: Temporal  ?PainSc:   ? ? ?  ?  ?  ?  ?  ?  ? ?Precious Haws Milessa Hogan ? ? ? ? ?

## 2021-06-14 NOTE — Transfer of Care (Signed)
Immediate Anesthesia Transfer of Care Note ? ?Patient: Lindsey Todd ? ?Procedure(s) Performed: COLONOSCOPY WITH PROPOFOL ?ESOPHAGOGASTRODUODENOSCOPY (EGD) WITH PROPOFOL ? ?Patient Location: PACU and Endoscopy Unit ? ?Anesthesia Type:General ? ?Level of Consciousness: drowsy ? ?Airway & Oxygen Therapy: Patient Spontanous Breathing ? ?Post-op Assessment: Report given to RN and Post -op Vital signs reviewed and stable ? ?Post vital signs: Reviewed and stable ? ?Last Vitals:  ?Vitals Value Taken Time  ?BP 97/68 06/14/21 0945  ?Temp 36.3 ?C 06/14/21 0945  ?Pulse 88 06/14/21 0949  ?Resp 28 06/14/21 0949  ?SpO2 94 % 06/14/21 0949  ?Vitals shown include unvalidated device data. ? ?Last Pain:  ?Vitals:  ? 06/14/21 0945  ?TempSrc: Temporal  ?PainSc:   ?   ? ?  ? ?Complications: No notable events documented. ?

## 2021-06-14 NOTE — Op Note (Signed)
[facility] ?Gastroenterology ?Patient Name: Lindsey Todd ?Procedure Date: 06/14/2021 8:42 AM ?MRN: [id number] ?Account #: [id number] ?Date of Birth: [birth year] ?Admit Type: Outpatient ?Age: 71 ?Room: [facility] ENDO ROOM 2 ?Gender: Female ?Note Status: Finalized ?Instrument Name: Upper Endoscope [id number] ?Procedure:             Upper GI endoscopy ?Indications:           Follow-up of gastric polyps ?Providers:             [name] DO, DO ?Referring MD:          Gayland Curry MD, MD (Referring MD) ?Medicines:             Monitored Anesthesia Care ?Complications:         No immediate complications. Estimated blood loss:  ?                       Minimal. ?Procedure:             Pre-Anesthesia Assessment: ?                       - Prior to the procedure, a History and Physical was  ?                       performed, and patient medications and allergies were  ?                       reviewed. The patient is competent. The risks and  ?                       benefits of the procedure and the sedation options and  ?                       risks were discussed with the patient. All questions  ?                       were answered and informed consent was obtained.  ?                       Patient identification and proposed procedure were  ?                       verified by the physician, the nurse, the anesthetist  ?                       and the technician in the endoscopy suite. Mental  ?                       Status Examination: alert and oriented. Airway  ?                       Examination: normal oropharyngeal airway and neck  ?                       mobility. Respiratory Examination: clear to  ?                       auscultation. CV Examination: RRR, no murmurs, no S3  ?  or S4. Prophylactic Antibiotics: The patient does not  ?                       require prophylactic antibiotics. Prior  ?                       Anticoagulants: The patient has taken no previous  ?                        anticoagulant or antiplatelet agents. ASA Grade  ?                       Assessment: III - A patient with severe systemic  ?                       disease. After reviewing the risks and benefits, the  ?                       patient was deemed in satisfactory condition to  ?                       undergo the procedure. The anesthesia plan was to use  ?                       monitored anesthesia care (MAC). Immediately prior to  ?                       administration of medications, the patient was  ?                       re-assessed for adequacy to receive sedatives. The  ?                       heart rate, respiratory rate, oxygen saturations,  ?                       blood pressure, adequacy of pulmonary ventilation, and  ?                       response to care were monitored throughout the  ?                       procedure. The physical status of the patient was  ?                       re-assessed after the procedure. ?                       After obtaining informed consent, the endoscope was  ?                       passed under direct vision. Throughout the procedure,  ?                       the patient's blood pressure, pulse, and oxygen  ?                       saturations were monitored continuously. The Endoscope  ?  was introduced through the mouth, and advanced to the  ?                       second part of duodenum. The upper GI endoscopy was  ?                       accomplished without difficulty. The patient tolerated  ?                       the procedure well. ?Findings: ?     The duodenal bulb, first portion of the duodenum and second portion of  ?     the duodenum were normal. Estimated blood loss: none. ?     Diffuse moderate inflammation characterized by congestion (edema),  ?     erythema, friability and granularity was found in the entire examined  ?     stomach. Biopsies were taken with a cold forceps for Helicobacter pylori  ?     testing.  Estimated blood loss was minimal. ?     A single 1 to 2 mm sessile polyp with no bleeding and no stigmata of  ?     recent bleeding was found on the greater curvature of the stomach. The  ?     polyp was removed with a cold biopsy forceps. Resection and retrieval  ?     were complete. Estimated blood loss was minimal. ?     The Z-line was regular. Estimated blood loss: none. ?     Esophagogastric landmarks were identified: the gastroesophageal junction  ?     was found at 40 cm from the incisors. ?     The exam of the esophagus was otherwise normal. ?Impression:            - Normal duodenal bulb, first portion of the duodenum  ?                       and second portion of the duodenum. ?                       - Gastritis. Biopsied. ?                       - A single gastric polyp. Resected and retrieved. ?                       - Z-line regular. ?                       - Esophagogastric landmarks identified. ?Recommendation:        - Discharge patient to home. ?                       - Resume previous diet. ?                       - Continue present medications. ?                       - Await pathology results. ?                       - Return to GI clinic as previously scheduled. ?                       -  The findings and recommendations were discussed with  ?                       the patient. ?Procedure Code(s):     --- Professional --- ?                       [phone], Esophagogastroduodenoscopy, flexible,  ?                       transoral; with biopsy, single or multiple ?Diagnosis Code(s):     --- Professional --- ?                       K29.70, Gastritis, unspecified, without bleeding ?                       K31.7, Polyp of stomach and duodenum ?CPT copyright 2019 [employer]. All rights reserved. ?The codes documented in this report are preliminary and upon coder review may  ?be revised to meet current compliance requirements. ?Attending Participation: ?     I personally performed the entire  procedure. ?Volney American, DO ?[name] DO, DO ?06/14/2021 8:59:03 AM ?This report has been signed electronically. ?Number of Addenda: 0 ?Note Initiated On: 06/14/2021 8:42 AM ?Estimated Blood Loss:  Estimated blood loss was minimal. ?     [facility] ?

## 2021-06-15 ENCOUNTER — Encounter: Payer: Self-pay | Admitting: Gastroenterology

## 2021-06-16 LAB — SURGICAL PATHOLOGY

## 2021-10-27 ENCOUNTER — Other Ambulatory Visit: Payer: Self-pay | Admitting: Family Medicine

## 2021-10-27 DIAGNOSIS — Z1231 Encounter for screening mammogram for malignant neoplasm of breast: Secondary | ICD-10-CM

## 2021-11-17 ENCOUNTER — Ambulatory Visit
Admission: RE | Admit: 2021-11-17 | Discharge: 2021-11-17 | Disposition: A | Discharge status: Home / Self Care | Payer: Medicare HMO | Source: Ambulatory Visit | Attending: Family Medicine | Admitting: Family Medicine

## 2021-11-17 DIAGNOSIS — Z1231 Encounter for screening mammogram for malignant neoplasm of breast: Secondary | ICD-10-CM | POA: Diagnosis not present

## 2022-11-08 ENCOUNTER — Other Ambulatory Visit: Payer: Self-pay | Admitting: Family Medicine

## 2022-11-08 DIAGNOSIS — Z1231 Encounter for screening mammogram for malignant neoplasm of breast: Secondary | ICD-10-CM

## 2022-11-29 ENCOUNTER — Ambulatory Visit
Admission: RE | Admit: 2022-11-29 | Discharge: 2022-11-29 | Disposition: A | Discharge status: Home / Self Care | Payer: Medicare HMO | Source: Ambulatory Visit | Attending: Family Medicine | Admitting: Family Medicine

## 2022-11-29 DIAGNOSIS — Z1231 Encounter for screening mammogram for malignant neoplasm of breast: Secondary | ICD-10-CM | POA: Insufficient documentation

## 2023-01-05 ENCOUNTER — Encounter: Payer: Self-pay | Admitting: Gastroenterology

## 2023-01-23 ENCOUNTER — Ambulatory Visit: Payer: Medicare HMO | Admitting: General Practice

## 2023-01-23 ENCOUNTER — Encounter
Admission: RE | Disposition: A | Discharge status: Home / Self Care | Payer: Self-pay | Source: Home / Self Care | Attending: Gastroenterology

## 2023-01-23 ENCOUNTER — Ambulatory Visit
Admission: RE | Admit: 2023-01-23 | Discharge: 2023-01-23 | Disposition: A | Discharge status: Home / Self Care | Payer: Medicare HMO | Attending: Gastroenterology | Admitting: Gastroenterology

## 2023-01-23 ENCOUNTER — Other Ambulatory Visit: Payer: Self-pay

## 2023-01-23 ENCOUNTER — Encounter: Payer: Self-pay | Admitting: Gastroenterology

## 2023-01-23 DIAGNOSIS — K2950 Unspecified chronic gastritis without bleeding: Secondary | ICD-10-CM | POA: Diagnosis not present

## 2023-01-23 DIAGNOSIS — K760 Fatty (change of) liver, not elsewhere classified: Secondary | ICD-10-CM | POA: Diagnosis not present

## 2023-01-23 DIAGNOSIS — K31A11 Gastric intestinal metaplasia without dysplasia, involving the antrum: Secondary | ICD-10-CM | POA: Insufficient documentation

## 2023-01-23 DIAGNOSIS — E039 Hypothyroidism, unspecified: Secondary | ICD-10-CM | POA: Insufficient documentation

## 2023-01-23 DIAGNOSIS — K219 Gastro-esophageal reflux disease without esophagitis: Secondary | ICD-10-CM | POA: Insufficient documentation

## 2023-01-23 DIAGNOSIS — I10 Essential (primary) hypertension: Secondary | ICD-10-CM | POA: Insufficient documentation

## 2023-01-23 DIAGNOSIS — Z09 Encounter for follow-up examination after completed treatment for conditions other than malignant neoplasm: Secondary | ICD-10-CM | POA: Insufficient documentation

## 2023-01-23 HISTORY — DX: Personal history of adenomatous and serrated colon polyps: Z86.0101

## 2023-01-23 HISTORY — DX: Fatty (change of) liver, not elsewhere classified: K76.0

## 2023-01-23 HISTORY — PX: ESOPHAGOGASTRODUODENOSCOPY (EGD) WITH PROPOFOL: SHX5813

## 2023-01-23 HISTORY — DX: Polyp of stomach and duodenum: K31.7

## 2023-01-23 HISTORY — PX: BIOPSY: SHX5522

## 2023-01-23 HISTORY — DX: Presence of intraocular lens: Z96.1

## 2023-01-23 HISTORY — DX: Other specified bacterial intestinal infections: A04.8

## 2023-01-23 HISTORY — DX: Unspecified sensorineural hearing loss: H90.5

## 2023-01-23 SURGERY — ESOPHAGOGASTRODUODENOSCOPY (EGD) WITH PROPOFOL
Anesthesia: General

## 2023-01-23 MED ORDER — GLYCOPYRROLATE 0.2 MG/ML IJ SOLN
INTRAMUSCULAR | Status: DC | PRN
  Administered 2023-01-23: .2 mg via INTRAVENOUS

## 2023-01-23 MED ORDER — SODIUM CHLORIDE 0.9 % IV SOLN: INTRAVENOUS | Status: DC

## 2023-01-23 MED ORDER — LIDOCAINE HCL (CARDIAC) PF 100 MG/5ML IV SOSY
PREFILLED_SYRINGE | INTRAVENOUS | Status: DC | PRN
  Administered 2023-01-23: 100 mg via INTRAVENOUS

## 2023-01-23 MED ORDER — PROPOFOL 10 MG/ML IV BOLUS
INTRAVENOUS | Status: DC | PRN
  Administered 2023-01-23: 30 mg via INTRAVENOUS
  Administered 2023-01-23: 70 mg via INTRAVENOUS

## 2023-01-23 MED ORDER — PROPOFOL 500 MG/50ML IV EMUL
INTRAVENOUS | Status: DC | PRN
  Administered 2023-01-23: 150 ug/kg/min via INTRAVENOUS

## 2023-01-23 MED ORDER — PROPOFOL 1000 MG/100ML IV EMUL
INTRAVENOUS | Status: AC
  Filled 2023-01-23: qty 100

## 2023-01-23 NOTE — Transfer of Care (Signed)
Immediate Anesthesia Transfer of Care Note  Patient: Lindsey Todd  Procedure(s) Performed: ESOPHAGOGASTRODUODENOSCOPY (EGD) WITH PROPOFOL BIOPSY  Patient Location: Endoscopy Unit  Anesthesia Type:General  Level of Consciousness: awake and alert   Airway & Oxygen Therapy: Patient Spontanous Breathing  Post-op Assessment: Report given to RN and Post -op Vital signs reviewed and stable  Post vital signs: Reviewed and stable  Last Vitals:  Vitals Value Taken Time  BP 113/59 01/23/23 1143  Temp    Pulse 96 01/23/23 1143  Resp 19 01/23/23 1143  SpO2 91 % 01/23/23 1143  Vitals shown include unfiled device data.  Last Pain:  Vitals:   01/23/23 1102  TempSrc: Temporal  PainSc: 0-No pain         Complications: No notable events documented.

## 2023-01-23 NOTE — Interval H&P Note (Signed)
History and Physical Interval Note: Preprocedure H&P from 01/23/23  was reviewed and there was no interval change after seeing and examining the patient.  Written consent was obtained from the patient after discussion of risks, benefits, and alternatives. Patient has consented to proceed with Esophagogastroduodenoscopy with possible intervention   01/23/2023 11:27 AM  Lindsey Todd  has presented today for surgery, with the diagnosis of 211.1 (ICD-9-CM) - K31.7 (ICD-10-CM) - Hyperplastic polyps of stomach V12.09 (ICD-9-CM) - Z86.19 (ICD-10-CM) - History of Helicobacter pylori infection 535.50 (ICD-9-CM) - K29.70 (ICD-10-CM) - Gastritis without bleeding, unspecified chronicity, unspecified gastritis type.  The various methods of treatment have been discussed with the patient and family. After consideration of risks, benefits and other options for treatment, the patient has consented to  Procedure(s): ESOPHAGOGASTRODUODENOSCOPY (EGD) WITH PROPOFOL (N/A) as a surgical intervention.  The patient's history has been reviewed, patient examined, no change in status, stable for surgery.  I have reviewed the patient's chart and labs.  Questions were answered to the patient's satisfaction.     Jaynie Collins

## 2023-01-23 NOTE — Op Note (Addendum)
[facility] Gastroenterology Patient Name: Lindsey Todd Procedure Date: 01/23/2023 10:07 AM MRN: [id number] Account #: [id number] Date of Birth: 09/21/1950 Admit Type: Outpatient Age: 72 Room: [facility] ENDO ROOM 2 Gender: Female Note Status: Supervisor Override Instrument Name: Upper Endoscope [id number] Procedure:             Upper GI endoscopy Indications:           Surveillance procedure, Follow-up of gastric polyps Providers:             Trenda Moots, DO Referring MD:          Leim Fabry MD, MD (Referring MD) Medicines:             Monitored Anesthesia Care Complications:         No immediate complications. Estimated blood loss:                         Minimal. Procedure:             Pre-Anesthesia Assessment:                        - Prior to the procedure, a History and Physical was                         performed, and patient medications and allergies were                         reviewed. The patient is competent. The risks and                         benefits of the procedure and the sedation options and                         risks were discussed with the patient. All questions                         were answered and informed consent was obtained.                         Patient identification and proposed procedure were                         verified by the physician, the nurse, the anesthetist                         and the technician in the endoscopy suite. Mental                         Status Examination: alert and oriented. Airway                         Examination: normal oropharyngeal airway and neck                         mobility. Respiratory Examination: clear to                         auscultation. CV Examination: RRR, no murmurs, no S3  or S4. Prophylactic Antibiotics: The patient does not                         require prophylactic antibiotics. Prior                         Anticoagulants: The  patient has taken no anticoagulant                         or antiplatelet agents. ASA Grade Assessment: III - A                         patient with severe systemic disease. After reviewing                         the risks and benefits, the patient was deemed in                         satisfactory condition to undergo the procedure. The                         anesthesia plan was to use monitored anesthesia care                         (MAC). Immediately prior to administration of                         medications, the patient was re-assessed for adequacy                         to receive sedatives. The heart rate, respiratory                         rate, oxygen saturations, blood pressure, adequacy of                         pulmonary ventilation, and response to care were                         monitored throughout the procedure. The physical                         status of the patient was re-assessed after the                         procedure.                        After obtaining informed consent, the endoscope was                         passed under direct vision. Throughout the procedure,                         the patient's blood pressure, pulse, and oxygen                         saturations were monitored continuously. The Endoscope  was introduced through the mouth, and advanced to the                         second part of duodenum. The upper GI endoscopy was                         accomplished without difficulty. The patient tolerated                         the procedure well. Findings:      The duodenal bulb, first portion of the duodenum and second portion of       the duodenum were normal. Estimated blood loss: none.      The Z-line was regular.      Esophagogastric landmarks were identified: the gastroesophageal junction       was found at 36 cm from the incisors.      The exam of the esophagus was otherwise normal.      Diffuse  moderate inflammation characterized by erythema was found in the       entire examined stomach. Biopsies were taken with a cold forceps for       histology. Taken with [place] protocol. two jars- antrum and insicura in       one; gastric body in the other. Estimated blood loss was minimal.      The exam of the stomach was otherwise normal. Impression:            - Normal duodenal bulb, first portion of the duodenum                         and second portion of the duodenum.                        - Z-line regular.                        - Esophagogastric landmarks identified.                        - Gastritis. Biopsied. Recommendation:        - Patient has a contact number available for                         emergencies. The signs and symptoms of potential                         delayed complications were discussed with the patient.                         Return to normal activities tomorrow. Written                         discharge instructions were provided to the patient.                        - Discharge patient to home.                        - Resume previous diet.                        -  Continue present medications.                        - Await pathology results.                        - Repeat upper endoscopy for surveillance based on                         pathology results.                        - Return to GI office as previously scheduled.                        - The findings and recommendations were discussed with                         the patient. Procedure Code(s):     --- Professional ---                        [id number], Esophagogastroduodenoscopy, flexible,                         transoral; with biopsy, single or multiple Diagnosis Code(s):     --- Professional ---                        K29.70, Gastritis, unspecified, without bleeding CPT copyright 2022 American Medical Association. All rights reserved. The codes documented in this report are preliminary  and upon coder review may  be revised to meet current compliance requirements. Attending Participation:      I personally performed the entire procedure. Elfredia Nevins, DO Jaynie Collins DO, DO 01/23/2023 11:46:47 AM This report has been signed electronically. Number of Addenda: 0 Note Initiated On: 01/23/2023 10:07 AM Estimated Blood Loss:  Estimated blood loss was minimal.      [facility]

## 2023-01-23 NOTE — H&P (Signed)
Pre-Procedure H&P   Patient ID: Lindsey Todd is a 72 y.o. female.  Gastroenterology Provider: Jaynie Collins, DO  Referring Provider: Fransico Setters, NP PCP: Leim Fabry, MD  Date: 01/23/2023  HPI [name] is a 72 y.o. female who presents today for Esophagogastroduodenoscopy for gastric intestinal metaplasia, gastric hyperplastic polyp.  Patient with a history of H. pylori status post eradication.  She last underwent EGD in April 2023.  A hyperplastic polyp was removed less than 5 mm.  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  Today is being performed for further evaluation and potential surveillance purposes.  No nausea vomiting weight loss abdominal pain dysphagia or odynophagia.  Bowel movements have been regular without melena or hematochezia.  Past Medical History:  Diagnosis Date   Anemia    Eczema    Eczema    Elevated LDL cholesterol level    Fatty liver    Gout    Gout    Hx of adenomatous colonic polyps    Hyperlipidemia    Hypertension    Hypothyroidism    Iron deficiency    Iron deficiency    Iron deficiency    Polyp of stomach and duodenum    Pseudophakia of left eye    Pseudophakia of right eye    Rapid urease test for Helicobacter pylori infection positive    Sensorineural hearing loss    Thyroid disease    Vitamin B 12 deficiency     Past Surgical History:  Procedure Laterality Date   ABDOMINAL HYSTERECTOMY  07/2007   with oopherectomy   BREAST BIOPSY Right 2006   benign - stereotactic biopsy   BREAST BIOPSY Right 06/17/2015   right breast biopsy papilloma   BREAST LUMPECTOMY Right 12/19/2016   papilloma excised   BREAST LUMPECTOMY Right 12/19/2016   Procedure: EXCISION RIGHT BREAST RETROAREOLAR PAPILLOMA;  Surgeon: Earline Mayotte, MD;  Location: [facility] ORS;  Service: General;  Laterality: Right;   COLONOSCOPY     COLONOSCOPY WITH PROPOFOL N/A 09/16/2016    Procedure: COLONOSCOPY WITH PROPOFOL;  Surgeon: Scot Jun, MD;  Location: [facility] ENDOSCOPY;  Service: Endoscopy;  Laterality: N/A;   COLONOSCOPY WITH PROPOFOL N/A 06/14/2021   Procedure: COLONOSCOPY WITH PROPOFOL;  Surgeon: Jaynie Collins, DO;  Location: [facility] ENDOSCOPY;  Service: Gastroenterology;  Laterality: N/A;   ESOPHAGOGASTRODUODENOSCOPY N/A 01/12/2018   Procedure: ESOPHAGOGASTRODUODENOSCOPY (EGD);  Surgeon: Scot Jun, MD;  Location: [facility] ENDOSCOPY;  Service: Endoscopy;  Laterality: N/A;   ESOPHAGOGASTRODUODENOSCOPY (EGD) WITH PROPOFOL N/A 09/16/2016   Procedure: ESOPHAGOGASTRODUODENOSCOPY (EGD) WITH PROPOFOL;  Surgeon: Scot Jun, MD;  Location: [facility] ENDOSCOPY;  Service: Endoscopy;  Laterality: N/A;   ESOPHAGOGASTRODUODENOSCOPY (EGD) WITH PROPOFOL N/A 10/11/2017   Procedure: ESOPHAGOGASTRODUODENOSCOPY (EGD) WITH PROPOFOL;  Surgeon: Scot Jun, MD;  Location: [facility] ENDOSCOPY;  Service: Endoscopy;  Laterality: N/A;  BMI=40   ESOPHAGOGASTRODUODENOSCOPY (EGD) WITH PROPOFOL N/A 06/14/2021   Procedure: ESOPHAGOGASTRODUODENOSCOPY (EGD) WITH PROPOFOL;  Surgeon: Jaynie Collins, DO;  Location: [facility] ENDOSCOPY;  Service: Gastroenterology;  Laterality: N/A;   EYE SURGERY Bilateral 10/2015   TOTAL ABDOMINAL HYSTERECTOMY W/ BILATERAL SALPINGOOPHORECTOMY      Family History No h/o GI disease or malignancy  Review of Systems  Constitutional:  Negative for activity change, appetite change, chills, diaphoresis, fatigue, fever and unexpected weight change.  HENT:  Negative for trouble swallowing and voice change.   Respiratory:  Negative for shortness of breath and wheezing.  Cardiovascular:  Negative for chest pain, palpitations and leg swelling.  Gastrointestinal:  Negative for abdominal distention, abdominal pain, anal bleeding, blood in stool, constipation, diarrhea, nausea, rectal pain and vomiting.  Musculoskeletal:  Negative for arthralgias and myalgias.   Skin:  Negative for color change and pallor.  Neurological:  Negative for dizziness, syncope and weakness.  Psychiatric/Behavioral:  Negative for confusion.   All other systems reviewed and are negative.    Medications No current facility-administered medications on file prior to encounter.   Current Outpatient Medications on File Prior to Encounter  Medication Sig Dispense Refill   allopurinol (ZYLOPRIM) 100 MG tablet daily.      aspirin EC 81 MG tablet Take 81 mg by mouth daily.     atorvastatin (LIPITOR) 20 MG tablet   2   chlorthalidone (HYGROTON) 25 MG tablet Take 25 mg by mouth daily.     cholecalciferol (VITAMIN D3) 10 MCG (400 UNIT) TABS tablet Take 400 Units by mouth daily.     colchicine 0.6 MG tablet One by mouth twice a day for the next 3 days then stop the medication and only use when you have a flare     ferrous sulfate 325 (65 FE) MG tablet Take by mouth.     fluticasone (FLONASE) 50 MCG/ACT nasal spray Place into both nostrils daily.     Glucosamine-Chondroitin (GLUCOSAMINE CHONDR COMPLEX PO) Take by mouth.     levocetirizine (XYZAL) 5 MG tablet   10   levothyroxine (SYNTHROID, LEVOTHROID) 25 MCG tablet Take 25 mcg by mouth daily before breakfast.   0   losartan (COZAAR) 25 MG tablet Take 25 mg by mouth every morning.      Misc Natural Products (OSTEO BI-FLEX ADV TRIPLE ST) TABS Take 1 tablet by mouth 2 (two) times daily.     Misc Natural Products (TART CHERRY ADVANCED PO) Take 1 capsule by mouth 2 (two) times daily.     vitamin B-12 (CYANOCOBALAMIN) 100 MCG tablet Take 100 mcg by mouth daily.     vitamin C (ASCORBIC ACID) 500 MG tablet Take 500 mg by mouth daily.      atorvastatin (LIPITOR) 20 MG tablet Take 20 mg by mouth daily at 6 PM.      pimecrolimus (ELIDEL) 1 % cream Apply small amount with q-tip to each eyelid BID      Pertinent medications related to GI and procedure were reviewed by me with the patient prior to the procedure   Current  Facility-Administered Medications:    0.9 %  sodium chloride infusion, , Intravenous, Continuous, Jaynie Collins, DO, Last Rate: 20 mL/hr at 01/23/23 1107, New Bag at 01/23/23 1107  sodium chloride 20 mL/hr at 01/23/23 1107       No Known Allergies Allergies were reviewed by me prior to the procedure  Objective   Body mass index is 38.37 kg/m. Vitals:   01/23/23 1102  BP: (!) 154/83  Pulse: 81  Temp: (!) 97.4 F (36.3 C)  TempSrc: Temporal  SpO2: 100%  Weight: 111.1 kg  Height: 5\' 7"  (1.702 m)     Physical Exam Vitals and nursing note reviewed.  Constitutional:      General: She is not in acute distress.    Appearance: Normal appearance. She is obese. She is not ill-appearing, toxic-appearing or diaphoretic.  HENT:     Head: Normocephalic and atraumatic.     Nose: Nose normal.     Mouth/Throat:     Mouth: Mucous membranes are moist.  Pharynx: Oropharynx is clear.  Eyes:     General: No scleral icterus.    Extraocular Movements: Extraocular movements intact.  Cardiovascular:     Rate and Rhythm: Normal rate and regular rhythm.     Heart sounds: Normal heart sounds. No murmur heard.    No friction rub. No gallop.  Pulmonary:     Effort: Pulmonary effort is normal. No respiratory distress.     Breath sounds: Normal breath sounds. No wheezing, rhonchi or rales.  Abdominal:     General: Bowel sounds are normal. There is no distension.     Palpations: Abdomen is soft.     Tenderness: There is no abdominal tenderness. There is no guarding or rebound.  Musculoskeletal:     Cervical back: Neck supple.     Right lower leg: No edema.     Left lower leg: No edema.  Skin:    General: Skin is warm and dry.     Coloration: Skin is not jaundiced or pale.  Neurological:     General: No focal deficit present.     Mental Status: She is alert and oriented to person, place, and time. Mental status is at baseline.  Psychiatric:        Mood and Affect: Mood normal.         Behavior: Behavior normal.        Thought Content: Thought content normal.        Judgment: Judgment normal.      Assessment:  [name] is a 72 y.o. female  who presents today for Esophagogastroduodenoscopy for gastric intestinal metaplasia, gastric hyperplastic polyp .  Plan:  Esophagogastroduodenoscopy with possible intervention today  Esophagogastroduodenoscopy with possible biopsy, control of bleeding, polypectomy, and interventions as necessary has been discussed with the patient/patient representative. Informed consent was obtained from the patient/patient representative after explaining the indication, nature, and risks of the procedure including but not limited to death, bleeding, perforation, missed neoplasm/lesions, cardiorespiratory compromise, and reaction to medications. Opportunity for questions was given and appropriate answers were provided. Patient/patient representative has verbalized understanding is amenable to undergoing the procedure.   Jaynie Collins, DO  [facility] Gastroenterology  Portions of the record may have been created with voice recognition software. Occasional wrong-word or 'sound-a-like' substitutions may have occurred due to the inherent limitations of voice recognition software.  Read the chart carefully and recognize, using context, where substitutions may have occurred.

## 2023-01-23 NOTE — Anesthesia Preprocedure Evaluation (Signed)
Anesthesia Evaluation  Patient identified by MRN, date of birth, ID band Patient awake    Reviewed: Allergy & Precautions, NPO status , Patient's Chart, lab work & pertinent test results  History of Anesthesia Complications Negative for: history of anesthetic complications  Airway Mallampati: III  TM Distance: <3 FB Neck ROM: full    Dental  (+) Chipped, Dental Advidsory Given   Pulmonary neg pulmonary ROS, neg shortness of breath   Pulmonary exam normal        Cardiovascular hypertension, On Medications (-) angina Normal cardiovascular exam     Neuro/Psych negative neurological ROS  negative psych ROS   GI/Hepatic negative GI ROS, Neg liver ROS,neg GERD  ,,  Endo/Other  Hypothyroidism    Renal/GU      Musculoskeletal   Abdominal   Peds  Hematology negative hematology ROS (+)   Anesthesia Other Findings Past Medical History: No date: Anemia No date: Eczema No date: Elevated LDL cholesterol level No date: Gout No date: Gout No date: Hyperlipidemia No date: Hypertension No date: Hypothyroidism No date: Iron deficiency No date: Iron deficiency No date: Thyroid disease No date: Vitamin B 12 deficiency  Past Surgical History: 07/2007: ABDOMINAL HYSTERECTOMY     Comment:  with oopherectomy 2006: BREAST BIOPSY; Right     Comment:  benign - stereotactic biopsy 06/17/2015: BREAST BIOPSY; Right     Comment:  right breast biopsy papilloma 12/19/2016: BREAST LUMPECTOMY; Right     Comment:  papilloma excised 12/19/2016: BREAST LUMPECTOMY; Right     Comment:  Procedure: EXCISION RIGHT BREAST RETROAREOLAR PAPILLOMA;              Surgeon: Earline Mayotte, MD;  Location: [facility] ORS;                Service: General;  Laterality: Right; No date: COLONOSCOPY 09/16/2016: COLONOSCOPY WITH PROPOFOL; N/A     Comment:  Procedure: COLONOSCOPY WITH PROPOFOL;  Surgeon: Scot Jun, MD;  Location: [facility]  ENDOSCOPY;  Service:               Endoscopy;  Laterality: N/A; 01/12/2018: ESOPHAGOGASTRODUODENOSCOPY; N/A     Comment:  Procedure: ESOPHAGOGASTRODUODENOSCOPY (EGD);  Surgeon:               Scot Jun, MD;  Location: [facility] ENDOSCOPY;                Service: Endoscopy;  Laterality: N/A; 09/16/2016: ESOPHAGOGASTRODUODENOSCOPY (EGD) WITH PROPOFOL; N/A     Comment:  Procedure: ESOPHAGOGASTRODUODENOSCOPY (EGD) WITH               PROPOFOL;  Surgeon: Scot Jun, MD;  Location:               [facility] ENDOSCOPY;  Service: Endoscopy;  Laterality: N/A; 10/11/2017: ESOPHAGOGASTRODUODENOSCOPY (EGD) WITH PROPOFOL; N/A     Comment:  Procedure: ESOPHAGOGASTRODUODENOSCOPY (EGD) WITH               PROPOFOL;  Surgeon: Scot Jun, MD;  Location:               [facility] ENDOSCOPY;  Service: Endoscopy;  Laterality: N/A;                BMI=40 10/2015: EYE SURGERY; Bilateral No date: TOTAL ABDOMINAL HYSTERECTOMY W/ BILATERAL SALPINGOOPHORECTOMY  BMI    Body Mass Index: 33.67 kg/m      Reproductive/Obstetrics negative OB ROS  Anesthesia Physical Anesthesia Plan  ASA: 3  Anesthesia Plan: General   Post-op Pain Management: Minimal or no pain anticipated   Induction: Intravenous  PONV Risk Score and Plan: 3 and Propofol infusion, TIVA and Ondansetron  Airway Management Planned: Nasal Cannula  Additional Equipment: None  Intra-op Plan:   Post-operative Plan:   Informed Consent: I have reviewed the patients History and Physical, chart, labs and discussed the procedure including the risks, benefits and alternatives for the proposed anesthesia with the patient or authorized representative who has indicated his/her understanding and acceptance.     Dental advisory given  Plan Discussed with: CRNA and Surgeon  Anesthesia Plan Comments: (Discussed risks of anesthesia with patient, including possibility of difficulty with spontaneous  ventilation under anesthesia necessitating airway intervention, PONV, and rare risks such as cardiac or respiratory or neurological events, and allergic reactions. Discussed the role of CRNA in patient's perioperative care. Patient understands.)         Anesthesia Quick Evaluation

## 2023-01-24 ENCOUNTER — Encounter: Payer: Self-pay | Admitting: Gastroenterology

## 2023-01-24 NOTE — Anesthesia Postprocedure Evaluation (Signed)
Anesthesia Post Note  Patient: Lindsey Todd  Procedure(s) Performed: ESOPHAGOGASTRODUODENOSCOPY (EGD) WITH PROPOFOL BIOPSY  Patient location during evaluation: PACU Anesthesia Type: General Level of consciousness: awake and alert Pain management: pain level controlled Vital Signs Assessment: post-procedure vital signs reviewed and stable Respiratory status: spontaneous breathing, nonlabored ventilation, respiratory function stable and patient connected to nasal cannula oxygen Cardiovascular status: blood pressure returned to baseline and stable Postop Assessment: no apparent nausea or vomiting Anesthetic complications: no  No notable events documented.   Last Vitals:  Vitals:   01/23/23 1153 01/23/23 1203  BP: 95/73 132/74  Pulse: 91 90  Resp: 16 17  Temp:    SpO2: 94% 95%    Last Pain:  Vitals:   01/23/23 1203  TempSrc:   PainSc: 0-No pain                 Stephanie Coup

## 2023-01-25 LAB — SURGICAL PATHOLOGY

## 2023-11-07 ENCOUNTER — Other Ambulatory Visit: Payer: Self-pay | Admitting: Family Medicine

## 2023-11-07 DIAGNOSIS — Z1231 Encounter for screening mammogram for malignant neoplasm of breast: Secondary | ICD-10-CM

## 2023-12-05 ENCOUNTER — Ambulatory Visit
Admission: RE | Admit: 2023-12-05 | Discharge: 2023-12-05 | Disposition: A | Discharge status: Home / Self Care | Source: Ambulatory Visit | Attending: Family Medicine | Admitting: Family Medicine

## 2023-12-05 DIAGNOSIS — Z1231 Encounter for screening mammogram for malignant neoplasm of breast: Secondary | ICD-10-CM | POA: Diagnosis present
